# Patient Record
Sex: Female | Born: 1970 | Race: White | Hispanic: No | Marital: Married | Smoking: Former smoker
Health system: Southern US, Community
[De-identification: ages and names within clinical notes are randomized; demographics above are authoritative.]

## PROBLEM LIST (undated history)

## (undated) DIAGNOSIS — T4145XA Adverse effect of unspecified anesthetic, initial encounter: Secondary | ICD-10-CM

## (undated) DIAGNOSIS — N6009 Solitary cyst of unspecified breast: Secondary | ICD-10-CM

## (undated) DIAGNOSIS — I10 Essential (primary) hypertension: Secondary | ICD-10-CM

## (undated) DIAGNOSIS — T8859XA Other complications of anesthesia, initial encounter: Secondary | ICD-10-CM

## (undated) HISTORY — DX: Solitary cyst of unspecified breast: N60.09

## (undated) HISTORY — PX: BREAST CYST ASPIRATION: SHX578

---
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
## 1998-05-04 ENCOUNTER — Ambulatory Visit (HOSPITAL_BASED_OUTPATIENT_CLINIC_OR_DEPARTMENT_OTHER): Admission: RE | Admit: 1998-05-04 | Discharge: 1998-05-04 | Payer: Self-pay | Admitting: Orthopedic Surgery

## 1998-06-07 ENCOUNTER — Other Ambulatory Visit: Admission: RE | Admit: 1998-06-07 | Discharge: 1998-06-07 | Payer: Self-pay | Admitting: Obstetrics and Gynecology

## 1998-06-21 ENCOUNTER — Ambulatory Visit (HOSPITAL_COMMUNITY): Admission: RE | Admit: 1998-06-21 | Discharge: 1998-06-21 | Payer: Self-pay | Admitting: Obstetrics and Gynecology

## 1999-07-05 ENCOUNTER — Other Ambulatory Visit: Admission: RE | Admit: 1999-07-05 | Discharge: 1999-07-05 | Payer: Self-pay | Admitting: Obstetrics and Gynecology

## 2000-06-29 ENCOUNTER — Other Ambulatory Visit: Admission: RE | Admit: 2000-06-29 | Discharge: 2000-06-29 | Payer: Self-pay | Admitting: Obstetrics and Gynecology

## 2000-07-01 ENCOUNTER — Ambulatory Visit (HOSPITAL_COMMUNITY): Admission: RE | Admit: 2000-07-01 | Discharge: 2000-07-01 | Payer: Self-pay | Admitting: Obstetrics and Gynecology

## 2000-07-01 ENCOUNTER — Encounter: Payer: Self-pay | Admitting: Obstetrics and Gynecology

## 2001-02-15 ENCOUNTER — Inpatient Hospital Stay (HOSPITAL_COMMUNITY): Admission: AD | Admit: 2001-02-15 | Discharge: 2001-02-18 | Payer: Self-pay | Admitting: Obstetrics & Gynecology

## 2002-04-03 ENCOUNTER — Ambulatory Visit (HOSPITAL_COMMUNITY): Admission: AD | Admit: 2002-04-03 | Discharge: 2002-04-03 | Payer: Self-pay | Admitting: Obstetrics and Gynecology

## 2002-04-03 ENCOUNTER — Encounter: Payer: Self-pay | Admitting: Obstetrics and Gynecology

## 2002-04-19 ENCOUNTER — Other Ambulatory Visit: Admission: RE | Admit: 2002-04-19 | Discharge: 2002-04-19 | Payer: Self-pay | Admitting: Obstetrics and Gynecology

## 2002-11-11 ENCOUNTER — Inpatient Hospital Stay (HOSPITAL_COMMUNITY): Admission: AD | Admit: 2002-11-11 | Discharge: 2002-11-11 | Payer: Self-pay | Admitting: Obstetrics and Gynecology

## 2002-11-17 ENCOUNTER — Inpatient Hospital Stay (HOSPITAL_COMMUNITY): Admission: AD | Admit: 2002-11-17 | Discharge: 2002-11-17 | Payer: Self-pay | Admitting: Obstetrics & Gynecology

## 2003-09-01 ENCOUNTER — Other Ambulatory Visit: Admission: RE | Admit: 2003-09-01 | Discharge: 2003-09-01 | Payer: Self-pay | Admitting: Obstetrics and Gynecology

## 2003-10-11 ENCOUNTER — Ambulatory Visit (HOSPITAL_COMMUNITY): Admission: RE | Admit: 2003-10-11 | Discharge: 2003-10-11 | Payer: Self-pay

## 2004-09-24 ENCOUNTER — Other Ambulatory Visit: Admission: RE | Admit: 2004-09-24 | Discharge: 2004-09-24 | Payer: Self-pay | Admitting: Obstetrics and Gynecology

## 2006-12-24 ENCOUNTER — Encounter: Admission: RE | Admit: 2006-12-24 | Discharge: 2007-01-21 | Payer: Self-pay | Admitting: Nurse Practitioner

## 2011-08-28 ENCOUNTER — Ambulatory Visit: Payer: Self-pay

## 2011-09-26 ENCOUNTER — Ambulatory Visit: Payer: Self-pay

## 2013-11-24 ENCOUNTER — Ambulatory Visit: Payer: Self-pay | Admitting: Unknown Physician Specialty

## 2013-12-26 ENCOUNTER — Ambulatory Visit: Payer: Self-pay | Admitting: Obstetrics & Gynecology

## 2013-12-26 LAB — CBC
HCT: 37.4 % (ref 35.0–47.0)
HGB: 11.7 g/dL — ABNORMAL LOW (ref 12.0–16.0)
MCH: 29.1 pg (ref 26.0–34.0)
MCHC: 31.2 g/dL — ABNORMAL LOW (ref 32.0–36.0)
MCV: 93 fL (ref 80–100)
Platelet: 292 10*3/uL (ref 150–440)
RBC: 4 10*6/uL (ref 3.80–5.20)
RDW: 13.6 % (ref 11.5–14.5)
WBC: 11.5 10*3/uL — ABNORMAL HIGH (ref 3.6–11.0)

## 2013-12-30 ENCOUNTER — Ambulatory Visit: Payer: Self-pay | Admitting: Obstetrics & Gynecology

## 2013-12-30 HISTORY — PX: PARTIAL HYSTERECTOMY: SHX80

## 2013-12-31 LAB — HEMOGLOBIN: HGB: 11 g/dL — ABNORMAL LOW (ref 12.0–16.0)

## 2014-01-02 LAB — PATHOLOGY REPORT

## 2014-07-29 NOTE — Op Note (Signed)
PATIENT NAME:  Kelly, Lowery MR#:  [id number] DATE OF BIRTH:  06-08-1970  DATE OF PROCEDURE:  12/30/2013  PREOPERATIVE DIAGNOSIS: Menorrhagia.   POSTOPERATIVE DIAGNOSIS: Menorrhagia.  PROCEDURES: Laparoscopic supracervical hysterectomy, repair of cystotomy.   SURGEON: Glean Salen, M.D.   ASSISTANT: Malachy Mood, M.D.   ANESTHESIA: General.   ESTIMATED BLOOD LOSS: Minimal.   COMPLICATIONS: There was a dome of bladder cystotomy where bladder and cervix has significant adhesions. Ovaries were normal although they had 2 small 1 cm or less ovarian cysts with 1 on each ovary, which were aspirated.   DISPOSITION: To the recovery room in stable condition.   TECHNIQUE: The patient is prepped and draped in the usual sterile fashion after adequate anesthesia is obtained in the dorsal lithotomy position. Bladder has a Foley catheter inserted and a sponge stick is placed in the vagina for manipulation purposes.   Attention is then turned to the abdomen where a Veress needle is inserted through a 5 mm infraumbilical incision after Marcaine is used to anesthetize the skin. Veress needle placement is confirmed using the hanging drop technique and the abdomen is then insufflated with CO2 gas. The patient is placed in Trendelenburg position and the above-mentioned findings are visualized. In the right lower quadrant, an 11 mm trocar is then inserted under direct visualization with the laparoscope with no injuries or bleeding noted and this is placed lateral to the inferior epigastric blood vessels. A 5 mm incision and trocar is placed in the left lower quadrant.   The uterus is grasped with a grasper and the round ligaments are carefully coagulated and cut using 5 mm Harmonic scalpel. There are no visible fallopian tubes on either side consistent with the patient's history of prior salpingectomy for ectopic pregnancy, at least on 1 side, but no tubes are visualized on either side. The dissection is  carried down through the utero-ovarian blood vessels and their ligaments with preservation of the ovaries and their blood supply. The ovarian cysts on each ovary are very small and they are aspirated by touching them with bipolar cautery device, and no bleeding is noted from the site where they rupture clear fluid.   Dissection with the 5 mm Harmonic scalpel is carried down through the broad ligament anterior and posterior leaves to the level of the uterine arteries. The uterine arteries are carefully coagulated using bipolar cautery device. The bladder is attempted to be dissected free of the lower uterine segment and the cervix, although significant adhesions are noted from the patient's history of prior surgery, such as cesarean section. During the course of the dissection, a cystotomy is encountered at the dome of the bladder. Visualization with the laparoscope into the bladder reveals no other injuries and no involvement where the ureters come into the bladder.   Once the uterine arteries are carefully skeletonized, cauterized and enough of the cervix is freed from the dissection, the uterus is amputated. Approximately 1 cm of cervix is left in place. The endocervical canal is cauterized with Bovie electrocautery and hemostasis is assured in this vicinity. Irrigation with saline is performed with aspiration of all fluid.   The cystotomy is repaired with interrupted Polysorb sutures using the Endo Stitch device. An imbrication layer is also placed to oversew healthy tissue over the cystotomy incision repair. The bladder is then instilled with saline fluid with methylene blue and there is no leakage of fluid through the repair after 120 mL is injected into the bladder. The Foley catheter is then reattached.  The morcellator device is placed through the right lower quadrant incision and the uterus is then removed by morcellation process without difficulty or complication. The pelvic cavity is re-irrigated  as well as examined and excellent hemostasis is noted. Interceed is placed over the cervical stump to minimize adhesion formation in this vicinity. The patient is leveled. A fascial closure device is placed through the right lower quadrant incision with closure of this incision using a 0 Vicryl suture at the fascial site. The remaining trocars are removed with removing of all gas as possible done as well. In the right lower quadrant incision, a 3-0 Vicryl suture is used to close the subcutaneous layer and then the skin at all 3 sites is closed with Dermabond. Foley catheter is left in place as the patient goes to the recovery room and the sponge stick is removed. The patient tolerates the procedure well. All sponge, instrument, and needle counts are correct. ____________________________ R. Barnett Applebaum, MD rph:sb D: 12/30/2013 12:31:46 ET T: 12/30/2013 13:17:04 ET JOB#: [id number]  cc: Glean Salen, MD, <Dictator> Gae Dry MD ELECTRONICALLY SIGNED 12/30/2013 15:08

## 2016-01-07 ENCOUNTER — Telehealth: Payer: Self-pay | Admitting: Internal Medicine

## 2016-01-07 NOTE — Telephone Encounter (Signed)
I do not mind seeing her as a pt.  Do we have a spot to do a work in for this and then can set up for full establish care, etc?  Thanks.

## 2016-01-07 NOTE — Telephone Encounter (Signed)
Pt mother in law Lucas,Faye wants to know if Dr. Nicki Reaper will take her on as a new patient. Pt has a lump on her left breast that needs to be looked at. Please Advise. Thank you!  Call mother in law [phone]

## 2016-01-07 NOTE — Telephone Encounter (Signed)
Please advise 

## 2016-01-09 ENCOUNTER — Encounter: Payer: Self-pay | Admitting: Internal Medicine

## 2016-01-09 ENCOUNTER — Encounter (INDEPENDENT_AMBULATORY_CARE_PROVIDER_SITE_OTHER): Payer: Self-pay

## 2016-01-09 ENCOUNTER — Ambulatory Visit (INDEPENDENT_AMBULATORY_CARE_PROVIDER_SITE_OTHER): Payer: PRIVATE HEALTH INSURANCE | Admitting: Internal Medicine

## 2016-01-09 VITALS — BP 124/82 | HR 81 | Temp 98.3°F | Ht 66.5 in | Wt 146.2 lb

## 2016-01-09 DIAGNOSIS — N63 Unspecified lump in unspecified breast: Secondary | ICD-10-CM

## 2016-01-09 DIAGNOSIS — Z803 Family history of malignant neoplasm of breast: Secondary | ICD-10-CM

## 2016-01-09 DIAGNOSIS — N6002 Solitary cyst of left breast: Secondary | ICD-10-CM | POA: Diagnosis not present

## 2016-01-09 DIAGNOSIS — Z0000 Encounter for general adult medical examination without abnormal findings: Secondary | ICD-10-CM

## 2016-01-09 NOTE — Progress Notes (Signed)
Pre visit review using our clinic review tool, if applicable. No additional management support is needed unless otherwise documented below in the visit note. 

## 2016-01-09 NOTE — Progress Notes (Signed)
Patient ID: Kelly Lowery, female   DOB: [birth year], 45 y.o.   MRN: [id number]   Subjective:    Patient ID: Kelly Lowery, female    DOB: 1970/05/20, 45 y.o.   MRN: [id number]  HPI  Patient here as work in for left breast nodule and tenderness.  She is a new pt to me.  Has an establish care appt with me in the near future.  Called in with concerns of breast tenderness, so worked in for evaluation.  She reports that two years ago she had a breast cyst.  Checked with mammogram.  Felt no further intervention warranted.  States that she noticed some tenderness about 1 1/2 months ago.  Noticed tenderness in her left axilla and left breast.  Some nipple itching, but no discharge.  Persistent, so she decided to come in for evaluation.  Mother has a history of ovarian cancer.  Maternal grandmother - breast cancer.     History reviewed. No pertinent past medical history. Past Surgical History:  Procedure Laterality Date  . CESAREAN SECTION     two  . PARTIAL HYSTERECTOMY  12/30/2013  . TONSILLECTOMY  1974   Family History  Problem Relation Age of Onset  . Breast cancer Maternal Grandmother 47    47 and 60  . Lung cancer Paternal Aunt    Social History   Social History  . Marital status: Married    Spouse name: N/A  . Number of children: N/A  . Years of education: N/A   Social History Main Topics  . Smoking status: Heavy Tobacco Smoker    Packs/day: 0.50    Years: 20.00    Types: Cigarettes  . Smokeless tobacco: Never Used  . Alcohol use Yes     Comment: occasionally  . Drug use: No  . Sexual activity: Not Asked   Other Topics Concern  . None   Social History Narrative  . None    Outpatient Encounter Prescriptions as of 01/09/2016  Medication Sig  . aspirin EC 81 MG tablet Take 81 mg by mouth daily.  . Calcium Carbonate-Vit D-Min (CALCIUM 1200 PO) Take by mouth.  . cyanocobalamin 1000 MCG tablet Take 1,000 mcg by mouth daily.  . Multiple Vitamin (MULTIVITAMIN) capsule Take 1  capsule by mouth daily.   No facility-administered encounter medications on file as of 01/09/2016.     Review of Systems  Constitutional: Negative for appetite change and unexpected weight change.  Respiratory: Negative for cough, chest tightness and shortness of breath.   Cardiovascular: Negative for chest pain and leg swelling.  Gastrointestinal: Negative for abdominal pain, nausea and vomiting.  Musculoskeletal: Negative for back pain and myalgias.  Skin: Negative for color change and rash.  Neurological: Negative for dizziness and headaches.  Psychiatric/Behavioral: Negative for agitation and dysphoric mood.       Objective:    Physical Exam  Constitutional: She appears well-developed and well-nourished. No distress.  Neck: Neck supple.  Cardiovascular: Normal rate and regular rhythm.   Pulmonary/Chest: Effort normal and breath sounds normal. No respiratory distress.  Breast exam - no nipple discharge or nipple retraction present.  Left breast tenderness and fullness.  Right breast - non tender.  No palpable nodule right breast.  No axillary adenopathy.    Abdominal: Soft. Bowel sounds are normal. There is no tenderness.  Musculoskeletal: She exhibits no edema or tenderness.  Lymphadenopathy:    She has no cervical adenopathy.  Skin: No rash noted. No erythema.  Psychiatric: She has a  normal mood and affect. Her behavior is normal.    BP 124/82   Pulse 81   Temp 98.3 F (36.8 C) (Oral)   Ht 5' 6.5" (1.689 m)   Wt 146 lb 3.2 oz (66.3 kg)   SpO2 98%   BMI 23.24 kg/m  Wt Readings from Last 3 Encounters:  01/17/16 143 lb (64.9 kg)  01/09/16 146 lb 3.2 oz (66.3 kg)     Lab Results  Component Value Date   WBC 11.5 (H) 12/26/2013   HGB 11.0 (L) 12/31/2013   HCT 37.4 12/26/2013   PLT 292 12/26/2013       Assessment & Plan:   Problem List Items Addressed This Visit    Breast cyst, left    Left breast fullness and tenderness.  Has a history of known breast cyst.   Schedule diagnostic mammogram with ultrasound.  Further w/up pending.        Family history of breast cancer    Maternal GM with breast cancer.  Mother with history of ovarian.  Discussed genetic testing.        Health care maintenance    New pt to me.  Was worked in secondary to her concerns with breast tenderness.  Get her back in for physical.         Other Visit Diagnoses    Breast nodule    -  Primary   Relevant Orders   US BREAST LTD UNI LEFT INC AXILLA (Completed)       Einar Pheasant, MD

## 2016-01-10 ENCOUNTER — Ambulatory Visit
Admission: RE | Admit: 2016-01-10 | Discharge: 2016-01-10 | Disposition: A | Discharge status: Home / Self Care | Payer: 59 | Source: Ambulatory Visit | Attending: Internal Medicine | Admitting: Internal Medicine

## 2016-01-10 ENCOUNTER — Other Ambulatory Visit: Payer: Self-pay | Admitting: Internal Medicine

## 2016-01-10 DIAGNOSIS — N63 Unspecified lump in unspecified breast: Secondary | ICD-10-CM

## 2016-01-10 DIAGNOSIS — N6002 Solitary cyst of left breast: Secondary | ICD-10-CM | POA: Diagnosis not present

## 2016-01-11 ENCOUNTER — Other Ambulatory Visit: Payer: Self-pay | Admitting: Internal Medicine

## 2016-01-11 DIAGNOSIS — N6002 Solitary cyst of left breast: Secondary | ICD-10-CM

## 2016-01-11 NOTE — Progress Notes (Signed)
Opened in error

## 2016-01-15 ENCOUNTER — Other Ambulatory Visit: Payer: Self-pay | Admitting: Internal Medicine

## 2016-01-15 DIAGNOSIS — N6002 Solitary cyst of left breast: Secondary | ICD-10-CM

## 2016-01-15 NOTE — Progress Notes (Signed)
Order placed for referral.  

## 2016-01-16 ENCOUNTER — Encounter: Payer: Self-pay | Admitting: *Deleted

## 2016-01-17 ENCOUNTER — Ambulatory Visit (INDEPENDENT_AMBULATORY_CARE_PROVIDER_SITE_OTHER): Payer: PRIVATE HEALTH INSURANCE | Admitting: General Surgery

## 2016-01-17 ENCOUNTER — Encounter: Payer: Self-pay | Admitting: General Surgery

## 2016-01-17 ENCOUNTER — Inpatient Hospital Stay: Payer: Self-pay

## 2016-01-17 VITALS — BP 118/80 | HR 78 | Resp 14 | Ht 66.5 in | Wt 143.0 lb

## 2016-01-17 DIAGNOSIS — N6320 Unspecified lump in the left breast, unspecified quadrant: Secondary | ICD-10-CM

## 2016-01-17 DIAGNOSIS — N6002 Solitary cyst of left breast: Secondary | ICD-10-CM | POA: Diagnosis not present

## 2016-01-17 NOTE — Patient Instructions (Addendum)
Follow up as needed. The patient is aware to call back for any questions or concerns. Continue self breast exams. Call office for any new breast issues or concerns.

## 2016-01-17 NOTE — Progress Notes (Signed)
Patient ID: Kelly Lowery, female   DOB: 1970/08/29, 45 y.o.   MRN: [id number]  Chief Complaint  Patient presents with  . Other    mammogram    HPI Kelly Lowery is a 45 y.o. female.  who presents for a breast evaluation. The most recent mammogram and left ultrasound was done on 01-10-16. She states she has a history of mild nipple drainage until she had the hysterectomy and she had not seen any until the the mammogram. She has a known history of left breast cyst 2 years ago. She states a couple months ago her left breast became tender and both itching nipples. She does admit to bilateral breast tenderness and left axillary tenderness. Patient does perform regular self breast checks and gets regular mammograms done every 2 years.    She states she felt the lump in left breast about 2 months ago. She states it is tender to touch and maybe a little larger than before.  HPI  No past medical history on file.  Past Surgical History:  Procedure Laterality Date  . CESAREAN SECTION     two  . PARTIAL HYSTERECTOMY  12/30/2013  . TONSILLECTOMY  1974    Family History  Problem Relation Age of Onset  . Breast cancer Maternal Grandmother 47    47 and 60  . Lung cancer Paternal Aunt     Social History Social History  Substance Use Topics  . Smoking status: Heavy Tobacco Smoker    Packs/day: 0.50    Years: 20.00    Types: Cigarettes  . Smokeless tobacco: Never Used  . Alcohol use Yes     Comment: occasionally    Allergies  Allergen Reactions  . Keflex [Cephalexin] Rash    Current Outpatient Prescriptions  Medication Sig Dispense Refill  . aspirin EC 81 MG tablet Take 81 mg by mouth daily.    . Calcium Carbonate-Vit D-Min (CALCIUM 1200 PO) Take by mouth.    . cyanocobalamin 1000 MCG tablet Take 1,000 mcg by mouth daily.    . Multiple Vitamin (MULTIVITAMIN) capsule Take 1 capsule by mouth daily.     No current facility-administered medications for this visit.     Review of  Systems Review of Systems  Blood pressure 118/80, pulse 78, resp. rate 14, height 5' 6.5" (1.689 m), weight 143 lb (64.9 kg).  Physical Exam Physical Exam  Constitutional: She is oriented to person, place, and time. She appears well-developed and well-nourished.  HENT:  Mouth/Throat: Oropharynx is clear and moist.  Eyes: Conjunctivae are normal. No scleral icterus.  Neck: Neck supple.  Cardiovascular: Normal rate, regular rhythm and normal heart sounds.   Pulmonary/Chest: Effort normal and breath sounds normal. Right breast exhibits tenderness. Right breast exhibits no inverted nipple, no mass, no nipple discharge and no skin change. Left breast exhibits no inverted nipple, no mass, no nipple discharge, no skin change and no tenderness.    Left breast fuller than right breast. Tenderness right axillary tail. 3.5 cm area of fullness left breast at 1 o'clock.  Lymphadenopathy:    She has no cervical adenopathy.    She has no axillary adenopathy.  Neurological: She is alert and oriented to person, place, and time.  Skin: Skin is warm and dry.  Psychiatric: Her behavior is normal.    Data Reviewed Mammograms dated 01/10/2016 and left breast ultrasound the same day reviewed. BI-RADS-2.  Ultrasound examination of the left breast showed multiple cysts in the upper-outer quadrant, the palpable mass corresponding to  the largest lesion. This was a thick-walled (0.18 cm) irregular mass measuring up to 2.83 cm in diameter. Multiple adjacent smaller cyst measuring up to 0.6 cm were noted. Moderate retroareolar ductal dilatation noted.  The patient was amenable to aspiration for symptom relief. This was completed using 1 mL of 1% plain Xylocaine. Approximately 35 mL of clear fluid was obtained. Complete resolution of the lesion was noted. The cavity was injected with 10 mL of air without difficulty.  Examination of the right retroareolar area showed multiple dilated ducts without any intraductal  lesions. BI-RADS-2.  Assessment    Symptomatic left breast cyst, resolved on aspiration.    Plan    The patient will call if she develops recurrent discomfort, otherwise she may resume annual screening mammograms next fall with her primary care provider.    Follow up as needed. The patient is aware to call back for any questions or concerns. Continue self breast exams. Call office for any new breast issues or concerns.   This information has been scribed by Karie Fetch RN, BSN,BC.   Robert Bellow 01/17/2016, 9:15 PM

## 2016-01-21 ENCOUNTER — Encounter: Payer: Self-pay | Admitting: Internal Medicine

## 2016-01-21 DIAGNOSIS — Z803 Family history of malignant neoplasm of breast: Secondary | ICD-10-CM | POA: Insufficient documentation

## 2016-01-21 DIAGNOSIS — Z0000 Encounter for general adult medical examination without abnormal findings: Secondary | ICD-10-CM | POA: Insufficient documentation

## 2016-01-21 NOTE — Assessment & Plan Note (Signed)
Left breast fullness and tenderness.  Has a history of known breast cyst.  Schedule diagnostic mammogram with ultrasound.  Further w/up pending.

## 2016-01-21 NOTE — Assessment & Plan Note (Signed)
Maternal GM with breast cancer.  Mother with history of ovarian.  Discussed genetic testing.

## 2016-01-21 NOTE — Assessment & Plan Note (Signed)
New pt to me.  Was worked in secondary to her concerns with breast tenderness.  Get her back in for physical.

## 2016-02-13 ENCOUNTER — Ambulatory Visit (INDEPENDENT_AMBULATORY_CARE_PROVIDER_SITE_OTHER): Payer: PRIVATE HEALTH INSURANCE | Admitting: Internal Medicine

## 2016-02-13 ENCOUNTER — Encounter: Payer: Self-pay | Admitting: Internal Medicine

## 2016-02-13 VITALS — BP 120/82 | HR 76 | Temp 98.2°F | Ht 67.0 in | Wt 151.4 lb

## 2016-02-13 DIAGNOSIS — G479 Sleep disorder, unspecified: Secondary | ICD-10-CM

## 2016-02-13 DIAGNOSIS — R5383 Other fatigue: Secondary | ICD-10-CM | POA: Diagnosis not present

## 2016-02-13 DIAGNOSIS — Z13220 Encounter for screening for lipoid disorders: Secondary | ICD-10-CM

## 2016-02-13 DIAGNOSIS — Z0000 Encounter for general adult medical examination without abnormal findings: Secondary | ICD-10-CM | POA: Diagnosis not present

## 2016-02-13 DIAGNOSIS — Z803 Family history of malignant neoplasm of breast: Secondary | ICD-10-CM | POA: Diagnosis not present

## 2016-02-13 DIAGNOSIS — Z23 Encounter for immunization: Secondary | ICD-10-CM | POA: Diagnosis not present

## 2016-02-13 DIAGNOSIS — N6002 Solitary cyst of left breast: Secondary | ICD-10-CM

## 2016-02-13 NOTE — Progress Notes (Signed)
Pre visit review using our clinic review tool, if applicable. No additional management support is needed unless otherwise documented below in the visit note. 

## 2016-02-13 NOTE — Progress Notes (Signed)
Patient ID: Kelly Lowery, female   DOB: 04-Dec-1970, 45 y.o.   MRN: [id number]   Subjective:    Patient ID: Kelly Lowery, female    DOB: [birth year], 45 y.o.   MRN: [id number]  HPI  Patient here to establish care.  I saw her as a work in recently for breast mass.  Had mammogram 01/10/16 - [facility].  Referred to Dr Bary Castilla.  S/p aspiration of breast cyst.  Pain has resolved.  She reports noticing some increased fatigue.  Has some problems falling asleep.  States her mind just will not shut down at times.  Occasionally will take advil pm.  Handling stress well.  Stays active.  No chest pain.  No sob.  No acid reflux.  No abdominal pain or cramping.  Bowels stable.  Is s/p hysterectomy secondary to bleeding.    Past Medical History:  Diagnosis Date  . Breast cyst    s/p aspiration   Past Surgical History:  Procedure Laterality Date  . CESAREAN SECTION     two  . PARTIAL HYSTERECTOMY  12/30/2013  . TONSILLECTOMY  1974   Family History  Problem Relation Age of Onset  . Breast cancer Maternal Grandmother 47    47 and 60  . Lung cancer Paternal Aunt    Social History   Social History  . Marital status: Married    Spouse name: N/A  . Number of children: N/A  . Years of education: N/A   Social History Main Topics  . Smoking status: Light Tobacco Smoker    Packs/day: 0.50    Years: 20.00    Types: Cigarettes  . Smokeless tobacco: Never Used  . Alcohol use Yes     Comment: occasionally  . Drug use: No  . Sexual activity: Not Asked   Other Topics Concern  . None   Social History Narrative  . None    Outpatient Encounter Prescriptions as of 02/13/2016  Medication Sig  . aspirin EC 81 MG tablet Take 81 mg by mouth daily.  . Calcium Carbonate-Vit D-Min (CALCIUM 1200 PO) Take by mouth.  . cyanocobalamin 1000 MCG tablet Take 1,000 mcg by mouth daily.  . Multiple Vitamin (MULTIVITAMIN) capsule Take 1 capsule by mouth daily.   No facility-administered encounter medications on  file as of 02/13/2016.     Review of Systems  Constitutional: Positive for fatigue. Negative for appetite change and unexpected weight change.  HENT: Negative for congestion and sinus pressure.   Respiratory: Negative for cough, chest tightness and shortness of breath.   Cardiovascular: Negative for chest pain, palpitations and leg swelling.  Gastrointestinal: Negative for abdominal pain, diarrhea, nausea and vomiting.  Genitourinary: Negative for difficulty urinating and dysuria.  Musculoskeletal: Negative for back pain and joint swelling.  Skin: Negative for color change and rash.  Neurological: Negative for dizziness, light-headedness and headaches.  Psychiatric/Behavioral: Negative for agitation and dysphoric mood.       Objective:    Physical Exam  Constitutional: She appears well-developed and well-nourished. No distress.  HENT:  Nose: Nose normal.  Mouth/Throat: Oropharynx is clear and moist.  Neck: Neck supple. No thyromegaly present.  Cardiovascular: Normal rate and regular rhythm.   Pulmonary/Chest: Breath sounds normal. No respiratory distress. She has no wheezes.  Abdominal: Soft. Bowel sounds are normal. There is no tenderness.  Musculoskeletal: She exhibits no edema or tenderness.  Lymphadenopathy:    She has no cervical adenopathy.  Skin: No rash noted. No erythema.  Psychiatric: She has a normal  mood and affect. Her behavior is normal.    BP 120/82   Pulse 76   Temp 98.2 F (36.8 C) (Oral)   Ht 5\' 7"  (1.702 m)   Wt 151 lb 6.4 oz (68.7 kg)   SpO2 98%   BMI 23.71 kg/m  Wt Readings from Last 3 Encounters:  02/13/16 151 lb 6.4 oz (68.7 kg)  01/17/16 143 lb (64.9 kg)  01/09/16 146 lb 3.2 oz (66.3 kg)     Lab Results  Component Value Date   WBC 11.5 (H) 12/26/2013   HGB 11.0 (L) 12/31/2013   HCT 37.4 12/26/2013   PLT 292 12/26/2013    US Breast Ltd Uni Left Inc Axilla  Result Date: 01/10/2016 CLINICAL DATA:  The patient complains of a lump in the  upper, outer left breast for 1 and half months. In addition, the patient has been having intermittent pain in her left axilla which extends across her superior left breast. She also feels that the superior left breast has been swelling. During the course of her mammogram, a small amount of yellowish discharge was noted from the left nipple. The patient reports that she had yellowish clear nipple discharge up until about 5 years after having her last child 14 years ago. She has not had any spontaneous discharge since that time. EXAM: 2D DIGITAL DIAGNOSTIC BILATERAL MAMMOGRAM WITH CAD AND ADJUNCT TOMO ULTRASOUND LEFT BREAST COMPARISON:  Previous exam(s). ACR Breast Density Category d: The breast tissue is extremely dense, which lowers the sensitivity of mammography. FINDINGS: There are multiple bilateral oval, circumscribed masses, the largest underlying the palpable marker in the upper, outer left breast measuring 3.3 cm. No suspicious mass, calcifications, or other abnormality is identified within either breast. Mammographic images were processed with CAD. On physical exam, there is an approximately 3.5 cm firm, mobile mass within the left breast at 1 o'clock, 2 cm from the nipple, corresponding to the palpable abnormality. No additional mass is felt in the areas of concern within the superior left breast and axilla. Targeted ultrasound is performed, showing a 3.4 x 1.9 x 2.6 cm simple cyst at 1 o'clock, 2 cm from the nipple, corresponding to the palpable abnormality. No suspicious cystic or solid sonographic findings were identified in the areas of concern within the superior left breast and axilla. Normal appearing axillary lymph nodes were incidentally noted. Targeted ultrasound of the left retroareolar region demonstrated no intraductal masses or other suspicious findings. IMPRESSION: Left breast cyst. No mammographic or sonographic evidence of malignancy. RECOMMENDATION: 1. Ultrasound-guided aspiration of a  left breast cyst at 1 o'clock, 2 cm from the nipple. 2.  Screening mammogram in one year.(Code:SM-B-01Y) I have discussed the findings and recommendations with the patient. Results were also provided in writing at the conclusion of the visit. If applicable, a reminder letter will be sent to the patient regarding the next appointment. BI-RADS CATEGORY  2: Benign. Electronically Signed   By: Pamelia Hoit M.D.   On: 01/10/2016 16:28   Mm Diag Breast Tomo Bilateral  Result Date: 01/10/2016 CLINICAL DATA:  The patient complains of a lump in the upper, outer left breast for 1 and half months. In addition, the patient has been having intermittent pain in her left axilla which extends across her superior left breast. She also feels that the superior left breast has been swelling. During the course of her mammogram, a small amount of yellowish discharge was noted from the left nipple. The patient reports that she had yellowish clear  nipple discharge up until about 5 years after having her last child 14 years ago. She has not had any spontaneous discharge since that time. EXAM: 2D DIGITAL DIAGNOSTIC BILATERAL MAMMOGRAM WITH CAD AND ADJUNCT TOMO ULTRASOUND LEFT BREAST COMPARISON:  Previous exam(s). ACR Breast Density Category d: The breast tissue is extremely dense, which lowers the sensitivity of mammography. FINDINGS: There are multiple bilateral oval, circumscribed masses, the largest underlying the palpable marker in the upper, outer left breast measuring 3.3 cm. No suspicious mass, calcifications, or other abnormality is identified within either breast. Mammographic images were processed with CAD. On physical exam, there is an approximately 3.5 cm firm, mobile mass within the left breast at 1 o'clock, 2 cm from the nipple, corresponding to the palpable abnormality. No additional mass is felt in the areas of concern within the superior left breast and axilla. Targeted ultrasound is performed, showing a 3.4 x 1.9 x 2.6 cm  simple cyst at 1 o'clock, 2 cm from the nipple, corresponding to the palpable abnormality. No suspicious cystic or solid sonographic findings were identified in the areas of concern within the superior left breast and axilla. Normal appearing axillary lymph nodes were incidentally noted. Targeted ultrasound of the left retroareolar region demonstrated no intraductal masses or other suspicious findings. IMPRESSION: Left breast cyst. No mammographic or sonographic evidence of malignancy. RECOMMENDATION: 1. Ultrasound-guided aspiration of a left breast cyst at 1 o'clock, 2 cm from the nipple. 2.  Screening mammogram in one year.(Code:SM-B-01Y) I have discussed the findings and recommendations with the patient. Results were also provided in writing at the conclusion of the visit. If applicable, a reminder letter will be sent to the patient regarding the next appointment. BI-RADS CATEGORY  2: Benign. Electronically Signed   By: Pamelia Hoit M.D.   On: 01/10/2016 16:28       Assessment & Plan:   Problem List Items Addressed This Visit    Breast cyst, left    S/p breast aspiration.  Pain resolved.  Doing well.  Mammogram 01/10/16 - [facility].        Family history of breast cancer    Mammogram 01/10/16 - [facility].        Fatigue    She relates to not being able to fall asleep.  Discussed some behavior modification.  Can try melatonin.  Follow.        Relevant Orders   CBC with Differential/Platelet   Comprehensive metabolic panel   TSH   VITAMIN D 25 Hydroxy (Vit-D Deficiency, Fractures)   Health care maintenance    Get her back in for physical.  Mammogram 01/10/16 -[facility].        Sleeping difficulties    Discussed behavior modification.  Discussed melatonin.  Follow.         Other Visit Diagnoses    Screening cholesterol level    -  Primary   Relevant Orders   Lipid panel   Encounter for immunization       Relevant Orders   Flu Vaccine QUAD 36+ mos IM (Completed)       Einar Pheasant, MD

## 2016-02-14 ENCOUNTER — Encounter: Payer: Self-pay | Admitting: Internal Medicine

## 2016-02-14 DIAGNOSIS — G479 Sleep disorder, unspecified: Secondary | ICD-10-CM | POA: Insufficient documentation

## 2016-02-14 DIAGNOSIS — R5383 Other fatigue: Secondary | ICD-10-CM | POA: Insufficient documentation

## 2016-02-14 NOTE — Assessment & Plan Note (Addendum)
Get her back in for physical.  Mammogram 01/10/16 -[facility].

## 2016-02-14 NOTE — Assessment & Plan Note (Signed)
Mammogram 01/10/16 - [facility].

## 2016-02-14 NOTE — Assessment & Plan Note (Signed)
S/p breast aspiration.  Pain resolved.  Doing well.  Mammogram 01/10/16 - [facility].

## 2016-02-14 NOTE — Assessment & Plan Note (Signed)
She relates to not being able to fall asleep.  Discussed some behavior modification.  Can try melatonin.  Follow.

## 2016-02-14 NOTE — Assessment & Plan Note (Signed)
Discussed behavior modification.  Discussed melatonin.  Follow.

## 2016-02-21 ENCOUNTER — Other Ambulatory Visit (INDEPENDENT_AMBULATORY_CARE_PROVIDER_SITE_OTHER): Payer: PRIVATE HEALTH INSURANCE

## 2016-02-21 DIAGNOSIS — Z13220 Encounter for screening for lipoid disorders: Secondary | ICD-10-CM

## 2016-02-21 DIAGNOSIS — R5383 Other fatigue: Secondary | ICD-10-CM | POA: Diagnosis not present

## 2016-02-21 LAB — CBC WITH DIFFERENTIAL/PLATELET
Basophils Absolute: 0.1 10*3/uL (ref 0.0–0.1)
Basophils Relative: 0.7 % (ref 0.0–3.0)
Eosinophils Absolute: 0.1 10*3/uL (ref 0.0–0.7)
Eosinophils Relative: 1.1 % (ref 0.0–5.0)
HCT: 38.8 % (ref 36.0–46.0)
Hemoglobin: 13 g/dL (ref 12.0–15.0)
Lymphocytes Relative: 39.1 % (ref 12.0–46.0)
Lymphs Abs: 3.4 10*3/uL (ref 0.7–4.0)
MCHC: 33.6 g/dL (ref 30.0–36.0)
MCV: 91 fl (ref 78.0–100.0)
Monocytes Absolute: 0.6 10*3/uL (ref 0.1–1.0)
Monocytes Relative: 7.4 % (ref 3.0–12.0)
Neutro Abs: 4.5 10*3/uL (ref 1.4–7.7)
Neutrophils Relative %: 51.7 % (ref 43.0–77.0)
Platelets: 330 10*3/uL (ref 150.0–400.0)
RBC: 4.26 Mil/uL (ref 3.87–5.11)
RDW: 13.1 % (ref 11.5–15.5)
WBC: 8.7 10*3/uL (ref 4.0–10.5)

## 2016-02-21 LAB — COMPREHENSIVE METABOLIC PANEL
ALT: 22 U/L (ref 0–35)
AST: 22 U/L (ref 0–37)
Albumin: 4.3 g/dL (ref 3.5–5.2)
Alkaline Phosphatase: 71 U/L (ref 39–117)
BUN: 12 mg/dL (ref 6–23)
CO2: 27 mEq/L (ref 19–32)
Calcium: 9.4 mg/dL (ref 8.4–10.5)
Chloride: 106 mEq/L (ref 96–112)
Creatinine, Ser: 0.89 mg/dL (ref 0.40–1.20)
GFR: 72.1 mL/min (ref >60.00)
Glucose, Bld: 99 mg/dL (ref 70–99)
Potassium: 4.6 mEq/L (ref 3.5–5.1)
Sodium: 139 mEq/L (ref 135–145)
Total Bilirubin: 0.4 mg/dL (ref 0.2–1.2)
Total Protein: 7 g/dL (ref 6.0–8.3)

## 2016-02-21 LAB — TSH: TSH: 1.37 u[IU]/mL (ref 0.35–4.50)

## 2016-02-21 LAB — LIPID PANEL
Cholesterol: 185 mg/dL (ref 0–200)
HDL: 58.2 mg/dL (ref >39.00)
LDL Cholesterol: 113 mg/dL — ABNORMAL HIGH (ref 0–99)
NonHDL: 127.06
Total CHOL/HDL Ratio: 3
Triglycerides: 69 mg/dL (ref 0.0–149.0)
VLDL: 13.8 mg/dL (ref 0.0–40.0)

## 2016-02-21 LAB — VITAMIN D 25 HYDROXY (VIT D DEFICIENCY, FRACTURES): VITD: 26.25 ng/mL — ABNORMAL LOW (ref 30.00–100.00)

## 2016-05-16 ENCOUNTER — Other Ambulatory Visit (HOSPITAL_COMMUNITY)
Admission: RE | Admit: 2016-05-16 | Discharge: 2016-05-16 | Disposition: A | Discharge status: Home / Self Care | Payer: 59 | Source: Ambulatory Visit | Attending: Internal Medicine | Admitting: Internal Medicine

## 2016-05-16 ENCOUNTER — Ambulatory Visit (INDEPENDENT_AMBULATORY_CARE_PROVIDER_SITE_OTHER): Payer: PRIVATE HEALTH INSURANCE | Admitting: Internal Medicine

## 2016-05-16 ENCOUNTER — Encounter: Payer: Self-pay | Admitting: Internal Medicine

## 2016-05-16 VITALS — BP 110/62 | HR 79 | Temp 97.8°F | Resp 16 | Ht 67.13 in | Wt 153.6 lb

## 2016-05-16 DIAGNOSIS — Z124 Encounter for screening for malignant neoplasm of cervix: Secondary | ICD-10-CM | POA: Insufficient documentation

## 2016-05-16 DIAGNOSIS — N6002 Solitary cyst of left breast: Secondary | ICD-10-CM

## 2016-05-16 DIAGNOSIS — Z1151 Encounter for screening for human papillomavirus (HPV): Secondary | ICD-10-CM | POA: Diagnosis present

## 2016-05-16 DIAGNOSIS — Z0000 Encounter for general adult medical examination without abnormal findings: Secondary | ICD-10-CM | POA: Diagnosis not present

## 2016-05-16 DIAGNOSIS — Z803 Family history of malignant neoplasm of breast: Secondary | ICD-10-CM | POA: Diagnosis not present

## 2016-05-16 MED ORDER — VARENICLINE TARTRATE 1 MG PO TABS: 1.0000 mg | ORAL_TABLET | Freq: Two times a day (BID) | ORAL | 1 refills | Status: DC

## 2016-05-16 MED ORDER — VARENICLINE TARTRATE 0.5 MG X 11 & 1 MG X 42 PO MISC: ORAL | 0 refills | Status: DC

## 2016-05-16 NOTE — Progress Notes (Signed)
Pre-visit discussion using our clinic review tool. No additional management support is needed unless otherwise documented below in the visit note.  

## 2016-05-16 NOTE — Progress Notes (Signed)
Patient ID: Kelly Lowery, female   DOB: 1970-09-14, 46 y.o.   MRN: [id number]   Subjective:    Patient ID: Kelly Lowery, female    DOB: 1970-04-17, 46 y.o.   MRN: [id number]  HPI  Patient here for her physical exam.  She reports she is doing well.  Tries to stay active.  No chest pain.  No sob.  No acid reflux.  No abdominal pain or cramping.  Bowels stable.  Handling stress.  Overall feels good.     Past Medical History:  Diagnosis Date  . Breast cyst    s/p aspiration   Past Surgical History:  Procedure Laterality Date  . CESAREAN SECTION     two  . PARTIAL HYSTERECTOMY  12/30/2013  . TONSILLECTOMY  1974   Family History  Problem Relation Age of Onset  . Breast cancer Maternal Grandmother 47    47 and 60  . Lung cancer Paternal Aunt    Social History   Social History  . Marital status: Married    Spouse name: N/A  . Number of children: N/A  . Years of education: N/A   Social History Main Topics  . Smoking status: Light Tobacco Smoker    Packs/day: 0.50    Years: 20.00    Types: Cigarettes  . Smokeless tobacco: Never Used  . Alcohol use Yes     Comment: occasionally  . Drug use: No  . Sexual activity: Not Asked   Other Topics Concern  . None   Social History Narrative  . None    Outpatient Encounter Prescriptions as of 05/16/2016  Medication Sig  . aspirin EC 81 MG tablet Take 81 mg by mouth daily.  . Calcium Carbonate-Vit D-Min (CALCIUM 1200 PO) Take by mouth.  . cyanocobalamin 1000 MCG tablet Take 1,000 mcg by mouth daily.  . Multiple Vitamin (MULTIVITAMIN) capsule Take 1 capsule by mouth daily.  . varenicline (CHANTIX CONTINUING MONTH PAK) 1 MG tablet Take 1 tablet (1 mg total) by mouth 2 (two) times daily.  . varenicline (CHANTIX STARTING MONTH PAK) 0.5 MG X 11 & 1 MG X 42 tablet Take one 0.5 mg tablet by mouth once daily for 3 days, then increase to one 0.5 mg tablet twice daily for 4 days, then increase to one 1 mg tablet twice daily.   No  facility-administered encounter medications on file as of 05/16/2016.     Review of Systems  Constitutional: Negative for appetite change and unexpected weight change.  HENT: Negative for congestion and sinus pressure.   Eyes: Negative for pain and visual disturbance.  Respiratory: Negative for cough, chest tightness and shortness of breath.   Cardiovascular: Negative for chest pain, palpitations and leg swelling.  Gastrointestinal: Negative for abdominal pain, diarrhea, nausea and vomiting.  Genitourinary: Negative for difficulty urinating and dysuria.  Musculoskeletal: Negative for back pain and joint swelling.  Skin: Negative for color change and rash.  Neurological: Negative for dizziness, light-headedness and headaches.  Hematological: Negative for adenopathy. Does not bruise/bleed easily.  Psychiatric/Behavioral: Negative for agitation and dysphoric mood.       Objective:    Physical Exam  Constitutional: She is oriented to person, place, and time. She appears well-developed and well-nourished. No distress.  HENT:  Nose: Nose normal.  Mouth/Throat: Oropharynx is clear and moist.  Eyes: Right eye exhibits no discharge. Left eye exhibits no discharge. No scleral icterus.  Neck: Neck supple. No thyromegaly present.  Cardiovascular: Normal rate and regular rhythm.   Pulmonary/Chest:  Breath sounds normal. No accessory muscle usage. No tachypnea. No respiratory distress. She has no decreased breath sounds. She has no wheezes. She has no rhonchi. Right breast exhibits no inverted nipple, no mass, no nipple discharge and no tenderness (no axillary adenopathy). Left breast exhibits no inverted nipple, no mass, no nipple discharge and no tenderness (no axilarry adenopathy).  Abdominal: Soft. Bowel sounds are normal. There is no tenderness.  Genitourinary:  Genitourinary Comments: Normal external genitalia.  Vaginal vault without lesions.  Cervix identified.  Pap smear performed.  Could not  appreciate any adnexal masses or tenderness.    Musculoskeletal: She exhibits no edema or tenderness.  Lymphadenopathy:    She has no cervical adenopathy.  Neurological: She is alert and oriented to person, place, and time.  Skin: Skin is warm. No rash noted. No erythema.  Psychiatric: She has a normal mood and affect. Her behavior is normal.    BP 110/62 (BP Location: Left Arm, Patient Position: Sitting, Cuff Size: Large)   Pulse 79   Temp 97.8 F (36.6 C) (Oral)   Resp 16   Ht 5' 7.13" (1.705 m)   Wt 153 lb 9.6 oz (69.7 kg)   SpO2 99%   BMI 23.97 kg/m  Wt Readings from Last 3 Encounters:  05/16/16 153 lb 9.6 oz (69.7 kg)  02/13/16 151 lb 6.4 oz (68.7 kg)  01/17/16 143 lb (64.9 kg)     Lab Results  Component Value Date   WBC 8.7 02/21/2016   HGB 13.0 02/21/2016   HCT 38.8 02/21/2016   PLT 330.0 02/21/2016   GLUCOSE 99 02/21/2016   CHOL 185 02/21/2016   TRIG 69.0 02/21/2016   HDL 58.20 02/21/2016   LDLCALC 113 (H) 02/21/2016   ALT 22 02/21/2016   AST 22 02/21/2016   NA 139 02/21/2016   K 4.6 02/21/2016   CL 106 02/21/2016   CREATININE 0.89 02/21/2016   BUN 12 02/21/2016   CO2 27 02/21/2016   TSH 1.37 02/21/2016    US Breast Ltd Uni Left Inc Axilla  Result Date: 01/10/2016 CLINICAL DATA:  The patient complains of a lump in the upper, outer left breast for 1 and half months. In addition, the patient has been having intermittent pain in her left axilla which extends across her superior left breast. She also feels that the superior left breast has been swelling. During the course of her mammogram, a small amount of yellowish discharge was noted from the left nipple. The patient reports that she had yellowish clear nipple discharge up until about 5 years after having her last child 14 years ago. She has not had any spontaneous discharge since that time. EXAM: 2D DIGITAL DIAGNOSTIC BILATERAL MAMMOGRAM WITH CAD AND ADJUNCT TOMO ULTRASOUND LEFT BREAST COMPARISON:  Previous  exam(s). ACR Breast Density Category d: The breast tissue is extremely dense, which lowers the sensitivity of mammography. FINDINGS: There are multiple bilateral oval, circumscribed masses, the largest underlying the palpable marker in the upper, outer left breast measuring 3.3 cm. No suspicious mass, calcifications, or other abnormality is identified within either breast. Mammographic images were processed with CAD. On physical exam, there is an approximately 3.5 cm firm, mobile mass within the left breast at 1 o'clock, 2 cm from the nipple, corresponding to the palpable abnormality. No additional mass is felt in the areas of concern within the superior left breast and axilla. Targeted ultrasound is performed, showing a 3.4 x 1.9 x 2.6 cm simple cyst at 1 o'clock, 2 cm  from the nipple, corresponding to the palpable abnormality. No suspicious cystic or solid sonographic findings were identified in the areas of concern within the superior left breast and axilla. Normal appearing axillary lymph nodes were incidentally noted. Targeted ultrasound of the left retroareolar region demonstrated no intraductal masses or other suspicious findings. IMPRESSION: Left breast cyst. No mammographic or sonographic evidence of malignancy. RECOMMENDATION: 1. Ultrasound-guided aspiration of a left breast cyst at 1 o'clock, 2 cm from the nipple. 2.  Screening mammogram in one year.(Code:SM-B-01Y) I have discussed the findings and recommendations with the patient. Results were also provided in writing at the conclusion of the visit. If applicable, a reminder letter will be sent to the patient regarding the next appointment. BI-RADS CATEGORY  2: Benign. Electronically Signed   By: Pamelia Hoit M.D.   On: 01/10/2016 16:28   Mm Diag Breast Tomo Bilateral  Result Date: 01/10/2016 CLINICAL DATA:  The patient complains of a lump in the upper, outer left breast for 1 and half months. In addition, the patient has been having intermittent pain  in her left axilla which extends across her superior left breast. She also feels that the superior left breast has been swelling. During the course of her mammogram, a small amount of yellowish discharge was noted from the left nipple. The patient reports that she had yellowish clear nipple discharge up until about 5 years after having her last child 14 years ago. She has not had any spontaneous discharge since that time. EXAM: 2D DIGITAL DIAGNOSTIC BILATERAL MAMMOGRAM WITH CAD AND ADJUNCT TOMO ULTRASOUND LEFT BREAST COMPARISON:  Previous exam(s). ACR Breast Density Category d: The breast tissue is extremely dense, which lowers the sensitivity of mammography. FINDINGS: There are multiple bilateral oval, circumscribed masses, the largest underlying the palpable marker in the upper, outer left breast measuring 3.3 cm. No suspicious mass, calcifications, or other abnormality is identified within either breast. Mammographic images were processed with CAD. On physical exam, there is an approximately 3.5 cm firm, mobile mass within the left breast at 1 o'clock, 2 cm from the nipple, corresponding to the palpable abnormality. No additional mass is felt in the areas of concern within the superior left breast and axilla. Targeted ultrasound is performed, showing a 3.4 x 1.9 x 2.6 cm simple cyst at 1 o'clock, 2 cm from the nipple, corresponding to the palpable abnormality. No suspicious cystic or solid sonographic findings were identified in the areas of concern within the superior left breast and axilla. Normal appearing axillary lymph nodes were incidentally noted. Targeted ultrasound of the left retroareolar region demonstrated no intraductal masses or other suspicious findings. IMPRESSION: Left breast cyst. No mammographic or sonographic evidence of malignancy. RECOMMENDATION: 1. Ultrasound-guided aspiration of a left breast cyst at 1 o'clock, 2 cm from the nipple. 2.  Screening mammogram in one year.(Code:SM-B-01Y) I  have discussed the findings and recommendations with the patient. Results were also provided in writing at the conclusion of the visit. If applicable, a reminder letter will be sent to the patient regarding the next appointment. BI-RADS CATEGORY  2: Benign. Electronically Signed   By: Pamelia Hoit M.D.   On: 01/10/2016 16:28       Assessment & Plan:   Problem List Items Addressed This Visit    Breast cyst, left    S/p breast aspiration.  Mammogram 01/10/16 - [facility].        Family history of breast cancer    Mammogram 01/10/16 - [facility].  Health care maintenance    Physical today 05/16/16.  PAP 05/16/16.  Mammogram 01/10/16 - [facility].         Other Visit Diagnoses    Routine cervical smear    -  Primary   Relevant Orders   Cytology - PAP (Completed)       Einar Pheasant, MD

## 2016-05-20 ENCOUNTER — Encounter: Payer: Self-pay | Admitting: *Deleted

## 2016-05-20 LAB — CYTOLOGY - PAP
Diagnosis: NEGATIVE
HPV: NOT DETECTED

## 2016-05-25 NOTE — Assessment & Plan Note (Signed)
Physical today 05/16/16.  PAP 05/16/16.  Mammogram 01/10/16 - [facility].

## 2016-05-25 NOTE — Assessment & Plan Note (Signed)
Mammogram 01/10/16 - [facility].

## 2016-05-25 NOTE — Assessment & Plan Note (Signed)
S/p breast aspiration.  Mammogram 01/10/16 - [facility].

## 2016-06-04 ENCOUNTER — Encounter: Payer: Self-pay | Admitting: Family

## 2016-06-04 ENCOUNTER — Ambulatory Visit (INDEPENDENT_AMBULATORY_CARE_PROVIDER_SITE_OTHER): Payer: PRIVATE HEALTH INSURANCE | Admitting: Family

## 2016-06-04 VITALS — BP 144/80 | HR 80 | Temp 98.1°F | Ht 67.0 in | Wt 152.0 lb

## 2016-06-04 DIAGNOSIS — J111 Influenza due to unidentified influenza virus with other respiratory manifestations: Secondary | ICD-10-CM

## 2016-06-04 LAB — POC INFLUENZA A&B (BINAX/QUICKVUE)
Influenza A, POC: NEGATIVE
Influenza B, POC: POSITIVE — AB

## 2016-06-04 MED ORDER — BENZONATATE 100 MG PO CAPS
100.0000 mg | ORAL_CAPSULE | Freq: Two times a day (BID) | ORAL | 0 refills | Status: DC | PRN
Start: 2016-06-04 — End: 2016-06-12

## 2016-06-04 MED ORDER — OSELTAMIVIR PHOSPHATE 75 MG PO CAPS: 75.0000 mg | ORAL_CAPSULE | Freq: Two times a day (BID) | ORAL | 0 refills | Status: DC

## 2016-06-04 NOTE — Progress Notes (Signed)
Pre visit review using our clinic review tool, if applicable. No additional management support is needed unless otherwise documented below in the visit note. 

## 2016-06-04 NOTE — Progress Notes (Signed)
Subjective:    Patient ID: Kelly Lowery, female    DOB: 1971/04/07, 46 y.o.   MRN: [id number]  CC: Kelly Lowery is a 46 y.o. female who presents today for an acute visit.    HPI: CC: cough, sinus congestion x 3 days, worsening. Started having diarrhea 3 days ago, non bloody. 5 times yesterday, twice today and becoming more formed. Eating bland diet and drinking fluids. No abdominal pain. Some nausea. No vomiting.  Endorses chills, HA, fever  ( tmax 100). Has taken tyelonol, claritin, advil,  with some relief. HA not severe, improved.   No SOB, wheezing, neck pain. Smoker, on chantix.   Lives with daughter and husband - both  [facility] patients. Would like tamiflu called in for them as well.     HISTORY:  Past Medical History:  Diagnosis Date  . Breast cyst    s/p aspiration   Past Surgical History:  Procedure Laterality Date  . CESAREAN SECTION     two  . PARTIAL HYSTERECTOMY  12/30/2013  . TONSILLECTOMY  1974   Family History  Problem Relation Age of Onset  . Breast cancer Maternal Grandmother 47    47 and 60  . Lung cancer Paternal Aunt     Allergies: Keflex [cephalexin] Current Outpatient Prescriptions on File Prior to Visit  Medication Sig Dispense Refill  . aspirin EC 81 MG tablet Take 81 mg by mouth daily.    . Calcium Carbonate-Vit D-Min (CALCIUM 1200 PO) Take by mouth.    . cyanocobalamin 1000 MCG tablet Take 1,000 mcg by mouth daily.    . Multiple Vitamin (MULTIVITAMIN) capsule Take 1 capsule by mouth daily.    . varenicline (CHANTIX CONTINUING MONTH PAK) 1 MG tablet Take 1 tablet (1 mg total) by mouth 2 (two) times daily. 60 tablet 1  . varenicline (CHANTIX STARTING MONTH PAK) 0.5 MG X 11 & 1 MG X 42 tablet Take one 0.5 mg tablet by mouth once daily for 3 days, then increase to one 0.5 mg tablet twice daily for 4 days, then increase to one 1 mg tablet twice daily. 53 tablet 0   No current facility-administered medications on file prior to visit.     Social  History  Substance Use Topics  . Smoking status: Light Tobacco Smoker    Packs/day: 0.50    Years: 20.00    Types: Cigarettes  . Smokeless tobacco: Never Used  . Alcohol use Yes     Comment: occasionally    Review of Systems  Constitutional: Positive for chills and fever.  HENT: Positive for congestion. Negative for rhinorrhea, sinus pain and sore throat.   Respiratory: Positive for cough. Negative for choking, shortness of breath and wheezing.   Cardiovascular: Negative for chest pain and palpitations.  Gastrointestinal: Positive for diarrhea. Negative for abdominal pain, constipation, nausea and vomiting.      Objective:    BP (!) 144/80   Pulse 80   Temp 98.1 F (36.7 C) (Oral)   Ht 5\' 7"  (1.702 m)   Wt 152 lb (68.9 kg)   SpO2 98%   BMI 23.81 kg/m    Physical Exam  Constitutional: She appears well-developed and well-nourished.  HENT:  Head: Normocephalic and atraumatic.  Right Ear: Hearing, tympanic membrane, external ear and ear canal normal. No drainage, swelling or tenderness. No foreign bodies. Tympanic membrane is not erythematous and not bulging. No middle ear effusion. No decreased hearing is noted.  Left Ear: Hearing, tympanic membrane, external ear and  ear canal normal. No drainage, swelling or tenderness. No foreign bodies. Tympanic membrane is not erythematous and not bulging.  No middle ear effusion. No decreased hearing is noted.  Nose: Nose normal. No rhinorrhea. Right sinus exhibits no maxillary sinus tenderness and no frontal sinus tenderness. Left sinus exhibits no maxillary sinus tenderness and no frontal sinus tenderness.  Mouth/Throat: Uvula is midline, oropharynx is clear and moist and mucous membranes are normal. No oropharyngeal exudate, posterior oropharyngeal edema, posterior oropharyngeal erythema or tonsillar abscesses.  Eyes: Conjunctivae are normal.  Cardiovascular: Normal rate, regular rhythm, normal heart sounds and normal pulses.     Pulmonary/Chest: Effort normal and breath sounds normal. She has no wheezes. She has no rhonchi. She has no rales.  Abdominal: Soft. Normal appearance and bowel sounds are normal. She exhibits no distension, no fluid wave, no ascites and no mass. There is no tenderness. There is no rigidity, no rebound, no guarding and no CVA tenderness.  Lymphadenopathy:       Head (right side): No submental, no submandibular, no tonsillar, no preauricular, no posterior auricular and no occipital adenopathy present.       Head (left side): No submental, no submandibular, no tonsillar, no preauricular, no posterior auricular and no occipital adenopathy present.    She has no cervical adenopathy.  Neurological: She is alert.  Skin: Skin is warm and dry.  Psychiatric: She has a normal mood and affect. Her speech is normal and behavior is normal. Thought content normal.  Vitals reviewed.      Assessment & Plan:   1. Influenza Flu positive.Patient is right at 72 hour mark. Since her symptoms are worsening, we jointly decided to go ahead and start Tamiflu. Reassured by benign abdominal exam and absence of any focal tenderness. Diarrhea also appears to be improving. advised bland diet plenty of fluids. Tessalon as needed. Also gave prescriptions for Tamiflu for husband and daughter who are patients of our practice.  - POC Influenza A&B (Binax test) - oseltamivir (TAMIFLU) 75 MG capsule; Take 1 capsule (75 mg total) by mouth 2 (two) times daily.  Dispense: 10 capsule; Refill: 0 - benzonatate (TESSALON) 100 MG capsule; Take 1 capsule (100 mg total) by mouth 2 (two) times daily as needed for cough.  Dispense: 20 capsule; Refill: 0    I am having Ms. Brashier maintain her cyanocobalamin, aspirin EC, multivitamin, Calcium Carbonate-Vit D-Min (CALCIUM 1200 PO), varenicline, and varenicline.   No orders of the defined types were placed in this encounter.   Return precautions given.   Risks, benefits, and  alternatives of the medications and treatment plan prescribed today were discussed, and patient expressed understanding.   Education regarding symptom management and diagnosis given to patient on AVS.  Continue to follow with Einar Pheasant, MD for routine health maintenance.   Kelly Lowery and I agreed with plan.   Mable Paris, FNP

## 2016-06-04 NOTE — Patient Instructions (Signed)
Let me know if diarrhea doesn't continue to improve  This illness can last up to 10 days. Complete medications as directed. Tamiflu on occasion has undesirable side effects including nausea and vomiting; if this occurs, you may stop the medication and continue symptom management at home.   Have all household members treated or placed on prophylaxis medications by contacting their primary care providers. Ensure adequate clear fluid intake. Obtain plenty of rest and provide symptomatic care. Use appropriate dose of Acetaminophen or Ibuprofen as needed for fever and/or pain. 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   Please follow up with our clinic if you develop a fever greater than 101 F, symptoms worsen, or do not resolve in the next week.

## 2016-06-12 ENCOUNTER — Encounter: Payer: Self-pay | Admitting: Family Medicine

## 2016-06-12 ENCOUNTER — Ambulatory Visit (INDEPENDENT_AMBULATORY_CARE_PROVIDER_SITE_OTHER): Payer: PRIVATE HEALTH INSURANCE | Admitting: Family Medicine

## 2016-06-12 ENCOUNTER — Telehealth: Payer: Self-pay | Admitting: Internal Medicine

## 2016-06-12 DIAGNOSIS — J329 Chronic sinusitis, unspecified: Secondary | ICD-10-CM | POA: Insufficient documentation

## 2016-06-12 DIAGNOSIS — J019 Acute sinusitis, unspecified: Secondary | ICD-10-CM | POA: Diagnosis not present

## 2016-06-12 MED ORDER — DOXYCYCLINE HYCLATE 100 MG PO TABS: 100.0000 mg | ORAL_TABLET | Freq: Two times a day (BID) | ORAL | 0 refills | Status: DC

## 2016-06-12 NOTE — Patient Instructions (Signed)
Antibiotic as prescribed.  Feel better.  Dr. Lacinda Axon   Sinusitis, Adult Sinusitis is soreness and inflammation of your sinuses. Sinuses are hollow spaces in the bones around your face. Your sinuses are located:  Around your eyes.  In the middle of your forehead.  Behind your nose.  In your cheekbones. Your sinuses and nasal passages are lined with a stringy fluid (mucus). Mucus normally drains out of your sinuses. When your nasal tissues become inflamed or swollen, the mucus can become trapped or blocked so air cannot flow through your sinuses. This allows bacteria, viruses, and funguses to grow, which leads to infection. Sinusitis can develop quickly and last for 7?10 days (acute) or for more than 12 weeks (chronic). Sinusitis often develops after a cold. What are the causes? This condition is caused by anything that creates swelling in the sinuses or stops mucus from draining, including:  Allergies.  Asthma.  Bacterial or viral infection.  Abnormally shaped bones between the nasal passages.  Nasal growths that contain mucus (nasal polyps).  Narrow sinus openings.  Pollutants, such as chemicals or irritants in the air.  A foreign object stuck in the nose.  A fungal infection. This is rare. What increases the risk? The following factors may make you more likely to develop this condition:  Having allergies or asthma.  Having had a recent cold or respiratory tract infection.  Having structural deformities or blockages in your nose or sinuses.  Having a weak immune system.  Doing a lot of swimming or diving.  Overusing nasal sprays.  Smoking. What are the signs or symptoms? The main symptoms of this condition are pain and a feeling of pressure around the affected sinuses. Other symptoms include:  Upper toothache.  Earache.  Headache.  Bad breath.  Decreased sense of smell and taste.  A cough that may get worse at night.  Fatigue.  Fever.  Thick  drainage from your nose. The drainage is often green and it may contain pus (purulent).  Stuffy nose or congestion.  Postnasal drip. This is when extra mucus collects in the throat or back of the nose.  Swelling and warmth over the affected sinuses.  Sore throat.  Sensitivity to light. How is this diagnosed? This condition is diagnosed based on symptoms, a medical history, and a physical exam. To find out if your condition is acute or chronic, your health care provider may:  Look in your nose for signs of nasal polyps.  Tap over the affected sinus to check for signs of infection.  View the inside of your sinuses using an imaging device that has a light attached (endoscope). If your health care provider suspects that you have chronic sinusitis, you may also:  Be tested for allergies.  Have a sample of mucus taken from your nose (nasal culture) and checked for bacteria.  Have a mucus sample examined to see if your sinusitis is related to an allergy. If your sinusitis does not respond to treatment and it lasts longer than 8 weeks, you may have an MRI or CT scan to check your sinuses. These scans also help to determine how severe your infection is. In rare cases, a bone biopsy may be done to rule out more serious types of fungal sinus disease. How is this treated? Treatment for sinusitis depends on the cause and whether your condition is chronic or acute. If a virus is causing your sinusitis, your symptoms will go away on their own within 10 days. You may be given  medicines to relieve your symptoms, including:  Topical nasal decongestants. They shrink swollen nasal passages and let mucus drain from your sinuses.  Antihistamines. These drugs block inflammation that is triggered by allergies. This can help to ease swelling in your nose and sinuses.  Topical nasal corticosteroids. These are nasal sprays that ease inflammation and swelling in your nose and sinuses.  Nasal saline washes.  These rinses can help to get rid of thick mucus in your nose. If your condition is caused by bacteria, you will be given an antibiotic medicine. If your condition is caused by a fungus, you will be given an antifungal medicine. Surgery may be needed to correct underlying conditions, such as narrow nasal passages. Surgery may also be needed to remove polyps. Follow these instructions at home: Medicines   Take, use, or apply over-the-counter and prescription medicines only as told by your health care provider. These may include nasal sprays.  If you were prescribed an antibiotic medicine, take it as told by your health care provider. Do not stop taking the antibiotic even if you start to feel better. Hydrate and Humidify   Drink enough water to keep your urine clear or pale yellow. Staying hydrated will help to thin your mucus.  Use a cool mist humidifier to keep the humidity level in your home above 50%.  Inhale steam for 10-15 minutes, 3-4 times a day or as told by your health care provider. You can do this in the bathroom while a hot shower is running.  Limit your exposure to cool or dry air. Rest   Rest as much as possible.  Sleep with your head raised (elevated).  Make sure to get enough sleep each night. General instructions   Apply a warm, moist washcloth to your face 3-4 times a day or as told by your health care provider. This will help with discomfort.  Wash your hands often with soap and water to reduce your exposure to viruses and other germs. If soap and water are not available, use hand sanitizer.  Do not smoke. Avoid being around people who are smoking (secondhand smoke).  Keep all follow-up visits as told by your health care provider. This is important. Contact a health care provider if:  You have a fever.  Your symptoms get worse.  Your symptoms do not improve within 10 days. Get help right away if:  You have a severe headache.  You have persistent  vomiting.  You have pain or swelling around your face or eyes.  You have vision problems.  You develop confusion.  Your neck is stiff.  You have trouble breathing. This information is not intended to replace advice given to you by your health care provider. Make sure you discuss any questions you have with your health care provider. Document Released: 03/24/2005 Document Revised: 11/18/2015 Document Reviewed: 01/17/2015 Elsevier Interactive Patient Education  2017 [employer].

## 2016-06-12 NOTE — Telephone Encounter (Signed)
Pt called at 12:27 today. She will be seeing Dr. Lacinda Axon at 4:15 today. She thinks she has a sinus infection.

## 2016-06-12 NOTE — Assessment & Plan Note (Signed)
New acute problem. Treating with Doxycycline.

## 2016-06-12 NOTE — Progress Notes (Signed)
Subjective:  Patient ID: [name], female    DOB: 02/15/1971  Age: 46 y.o. MRN: [id number]  CC: Concern for sinusitis  HPI:  46 year old female presents with the above complaint.  Patient states she had influenza last week. On Monday she developed cough, runny nose, ear pain, congestion, postnasal drip, and severe sinus pressure/pain and associated headache. She reports that she's having some dental pain as well. She reports chills but no documented fever. Symptoms are severe. She's been using Mucinex and Advil with little improvement. She did have some improvement in headache with Excedrin. No reports of significant nasal discharge. No known exacerbating factors. No other associated symptoms. No other complaints or concerns at this time.  Social Hx   Social History   Social History  . Marital status: Married    Spouse name: N/A  . Number of children: N/A  . Years of education: N/A   Social History Main Topics  . Smoking status: Light Tobacco Smoker    Packs/day: 0.50    Years: 20.00    Types: Cigarettes  . Smokeless tobacco: Never Used  . Alcohol use Yes     Comment: occasionally  . Drug use: No  . Sexual activity: Not Asked   Other Topics Concern  . None   Social History Narrative  . None    Review of Systems  Constitutional: Positive for chills.  HENT: Positive for congestion, ear pain, postnasal drip, rhinorrhea, sinus pain and sinus pressure.   Respiratory: Positive for cough.   Neurological: Positive for headaches.    Objective:  BP 137/84   Pulse 76   Temp 98 F (36.7 C) (Oral)   Wt 151 lb 8 oz (68.7 kg)   SpO2 98%   BMI 23.73 kg/m   BP/Weight 06/12/2016 2/69/4854 09/06/7033  Systolic BP 009 381 829  Diastolic BP 84 80 62  Wt. (Lbs) 151.5 152 153.6  BMI 23.73 23.81 23.97    Physical Exam  Constitutional: She is oriented to person, place, and time. She appears well-developed. No distress.  HENT:  Head: Normocephalic and atraumatic.    Mouth/Throat: Oropharynx is clear and moist.  Prior scarring noted on TMs. Significant maxillary and frontal sinus tenderness to palpation.  Neck: Neck supple.  Cardiovascular: Normal rate and regular rhythm.   Pulmonary/Chest: Effort normal and breath sounds normal.  Neurological: She is alert and oriented to person, place, and time.  Psychiatric: She has a normal mood and affect.  Vitals reviewed.   Lab Results  Component Value Date   WBC 8.7 02/21/2016   HGB 13.0 02/21/2016   HCT 38.8 02/21/2016   PLT 330.0 02/21/2016   GLUCOSE 99 02/21/2016   CHOL 185 02/21/2016   TRIG 69.0 02/21/2016   HDL 58.20 02/21/2016   LDLCALC 113 (H) 02/21/2016   ALT 22 02/21/2016   AST 22 02/21/2016   NA 139 02/21/2016   K 4.6 02/21/2016   CL 106 02/21/2016   CREATININE 0.89 02/21/2016   BUN 12 02/21/2016   CO2 27 02/21/2016   TSH 1.37 02/21/2016    Assessment & Plan:   Problem List Items Addressed This Visit    Acute sinusitis    New acute problem. Treating with Doxycycline.      Relevant Medications   doxycycline (VIBRA-TABS) 100 MG tablet      Meds ordered this encounter  Medications  . doxycycline (VIBRA-TABS) 100 MG tablet    Sig: Take 1 tablet (100 mg total) by mouth 2 (two) times daily.  Dispense:  20 tablet    Refill:  0    Follow-up: PRN  [place]

## 2016-06-12 NOTE — Telephone Encounter (Signed)
Pt called about now having some sinus issues. Pt is taking musinex and it's not clearing anything up. Pressure in her sinus cavity. Pt wants to know if she can get some antibiotic?  Pharmacy is [id number] - [address], [place]  Call pt @ [phone]. Thank you!

## 2016-06-12 NOTE — Progress Notes (Signed)
Pre visit review using our clinic review tool, if applicable. No additional management support is needed unless otherwise documented below in the visit note. 

## 2016-06-12 NOTE — Telephone Encounter (Signed)
Reason for call: sinuses  Symptoms:base of neck hurts, pressure in ears, headache forehead, head hurts, congestion, coughing hot/cold  Duration Monday  Medications:Mucinex finished Tamiflu  Last seen for this problem: 06/04/16 diagnosed with flu  Please advise

## 2016-06-13 ENCOUNTER — Telehealth: Payer: Self-pay | Admitting: Internal Medicine

## 2016-06-13 ENCOUNTER — Other Ambulatory Visit: Payer: Self-pay | Admitting: Family Medicine

## 2016-06-13 MED ORDER — LEVOFLOXACIN 500 MG PO TABS: 500.0000 mg | ORAL_TABLET | Freq: Every day | ORAL | 0 refills | Status: DC

## 2016-06-13 NOTE — Telephone Encounter (Signed)
New antibiotic was sent to the pharmacy.

## 2016-06-13 NOTE — Telephone Encounter (Signed)
Pt requested a call, after taking a dose of her antibiotic she starting having a rash through out her body. She requested a different antibiotic. Pt contact [phone]

## 2016-06-13 NOTE — Telephone Encounter (Signed)
Pt called stating that she had started to break out with a rash on her feet and hands. Pt stated it started after starting anti-biotic. Pt would like another anti-biotic. Pt was taking doxycycline 100 mg. Please send RX to [facility] [address].

## 2016-06-13 NOTE — Telephone Encounter (Signed)
Sending in new Rx.

## 2016-06-13 NOTE — Telephone Encounter (Signed)
Please advise for a different antibiotic? thanks

## 2016-06-16 ENCOUNTER — Telehealth: Payer: Self-pay | Admitting: Internal Medicine

## 2016-06-16 MED ORDER — AMOXICILLIN 875 MG PO TABS: 875.0000 mg | ORAL_TABLET | Freq: Two times a day (BID) | ORAL | 0 refills | Status: DC

## 2016-06-16 NOTE — Telephone Encounter (Signed)
Please call pt and see if she has taken pcn or amoxicillin previously.  If so, did she have any problems with this.

## 2016-06-16 NOTE — Telephone Encounter (Signed)
Pt has taken amoxicillin in the past with know problems. She would like to have it sent to [place]

## 2016-06-16 NOTE — Telephone Encounter (Signed)
Please confirm with pt no lip or tongue swelling or difficulty breathing.  I sent in rx for amoxicillin.

## 2016-06-16 NOTE — Telephone Encounter (Signed)
Pt informed will call if any questions she was not having any swelling in the mouth or trouble breathing.

## 2016-06-16 NOTE — Telephone Encounter (Signed)
Pt was seen by Dr. Lacinda Axon on 06/12/16 given doxycycline after taking two times she broke out in rash all over body. Was given a script for Levaquin in 06-13-16 and she states that she started swelling in hands and feet with joint pain so she d/c that after one day as well. She would like to have something called in before the end of the day. She has a flight in a few days and does not want to be sick.

## 2016-06-16 NOTE — Telephone Encounter (Signed)
Pt called about having a sinus infection pt saw Dr Lacinda Axon last week which the medication that was given had given her a major reaction it was antibiotic. Pt is going out of town next week and wants to know if she can get a antibiotic? Please advise? Pt does not want to see any other provider.   Call pt @ [phone]. Thank you!

## 2016-06-18 ENCOUNTER — Encounter: Payer: Self-pay | Admitting: Emergency Medicine

## 2016-06-18 ENCOUNTER — Emergency Department: Payer: 59

## 2016-06-18 ENCOUNTER — Emergency Department
Admission: EM | Admit: 2016-06-18 | Discharge: 2016-06-18 | Disposition: A | Discharge status: Home / Self Care | Payer: 59 | Attending: Emergency Medicine | Admitting: Emergency Medicine

## 2016-06-18 DIAGNOSIS — J209 Acute bronchitis, unspecified: Secondary | ICD-10-CM | POA: Diagnosis not present

## 2016-06-18 DIAGNOSIS — R0789 Other chest pain: Secondary | ICD-10-CM | POA: Diagnosis present

## 2016-06-18 DIAGNOSIS — Z79899 Other long term (current) drug therapy: Secondary | ICD-10-CM | POA: Insufficient documentation

## 2016-06-18 DIAGNOSIS — I10 Essential (primary) hypertension: Secondary | ICD-10-CM | POA: Insufficient documentation

## 2016-06-18 DIAGNOSIS — F17210 Nicotine dependence, cigarettes, uncomplicated: Secondary | ICD-10-CM | POA: Insufficient documentation

## 2016-06-18 DIAGNOSIS — Z7982 Long term (current) use of aspirin: Secondary | ICD-10-CM | POA: Insufficient documentation

## 2016-06-18 DIAGNOSIS — R202 Paresthesia of skin: Secondary | ICD-10-CM | POA: Diagnosis not present

## 2016-06-18 HISTORY — DX: Essential (primary) hypertension: I10

## 2016-06-18 LAB — BASIC METABOLIC PANEL
Anion gap: 6 (ref 5–15)
BUN: 16 mg/dL (ref 6–20)
CO2: 26 mmol/L (ref 22–32)
Calcium: 8.9 mg/dL (ref 8.9–10.3)
Chloride: 107 mmol/L (ref 101–111)
Creatinine, Ser: 0.75 mg/dL (ref 0.44–1.00)
GFR calc Af Amer: >60 mL/min (ref >60)
GFR calc non Af Amer: >60 mL/min (ref >60)
Glucose, Bld: 106 mg/dL — ABNORMAL HIGH (ref 65–99)
Potassium: 4.2 mmol/L (ref 3.5–5.1)
Sodium: 139 mmol/L (ref 135–145)

## 2016-06-18 LAB — CBC
HCT: 39 % (ref 35.0–47.0)
Hemoglobin: 13.6 g/dL (ref 12.0–16.0)
MCH: 31.1 pg (ref 26.0–34.0)
MCHC: 35 g/dL (ref 32.0–36.0)
MCV: 88.8 fL (ref 80.0–100.0)
Platelets: 417 10*3/uL (ref 150–440)
RBC: 4.39 MIL/uL (ref 3.80–5.20)
RDW: 13 % (ref 11.5–14.5)
WBC: 9.6 10*3/uL (ref 3.6–11.0)

## 2016-06-18 LAB — TROPONIN I: Troponin I: <0.03 ng/mL (ref <0.03)

## 2016-06-18 MED ORDER — PREDNISONE 20 MG PO TABS: 40.0000 mg | ORAL_TABLET | Freq: Every day | ORAL | 0 refills | Status: DC

## 2016-06-18 MED ORDER — ALBUTEROL SULFATE HFA 108 (90 BASE) MCG/ACT IN AERS: 2.0000 | INHALATION_SPRAY | Freq: Four times a day (QID) | RESPIRATORY_TRACT | 2 refills | Status: DC | PRN

## 2016-06-18 NOTE — Discharge Instructions (Signed)
We believe that your symptoms are caused today by an exacerbation of possibly bronchitis which may be "left over" after having the flu.  Please take the prescribed medications and any medications that you have at home for your including your amoxicillin. Follow up with your doctor as recommended.  If you develop any new or worsening symptoms, including but not limited to fever, persistent vomiting, worsening shortness of breath, or other symptoms that concern you, please return to the Emergency Department immediately.

## 2016-06-18 NOTE — ED Provider Notes (Signed)
[facility] Emergency Department Provider Note   ____________________________________________   First MD Initiated Contact with Patient 06/18/16 1402     (approximate)  I have reviewed the triage vital signs and the nursing notes.   HISTORY  Chief Complaint Chest Pain    HPI Kelly Lowery is a 46 y.o. female reports previously healthy, but did develop "the flu" about 3 weeks ago. Since then she's been slow to recover reports fatigue daily, sinus congestion has been treated with 2 antibiotics now for a sinus infection. She was initially started on doxycycline, but developed a rash, swelling in her hands legs joint aches and pain that a tingling sensation in both hands and arms after starting doxycycline, which she then stopped*Levaquin a few days ago, but this was causing stomach upset and was thus discontinued, and she has now been continuing on amoxicillin which she is tolerating well.  Today she noticed that the tingling feeling in her arms seem to worsen, she had a feeling of a tight this or discomfort while resting on the couch this morning. This prompted her to come the ER for further evaluation.  No headache,no weakness in the hands or arms or face. No facial droop. No trouble walking. Reports she is able to use her hands walk and speak just fine. No neck pain  Patient reports all symptoms have gone away now, she does take a baby aspirin daily.  She denies ever having any "chest pain". She reports it felt more like a discomfort up by her left shoulder that went away.  Past Medical History:  Diagnosis Date  . Breast cyst    s/p aspiration  . Hypertension     Patient Active Problem List   Diagnosis Date Noted  . Acute sinusitis 06/12/2016  . Sleeping difficulties 02/14/2016  . Family history of breast cancer 01/21/2016  . Health care maintenance 01/21/2016  . Breast cyst, left 01/17/2016    Past Surgical History:  Procedure Laterality Date    . CESAREAN SECTION     two  . PARTIAL HYSTERECTOMY  12/30/2013  . TONSILLECTOMY  1974    Prior to Admission medications   Medication Sig Start Date End Date Taking? Authorizing Provider  albuterol (PROVENTIL HFA;VENTOLIN HFA) 108 (90 Base) MCG/ACT inhaler Inhale 2 puffs into the lungs every 6 (six) hours as needed for wheezing or shortness of breath. 06/18/16   Delman Kitten, MD  amoxicillin (AMOXIL) 875 MG tablet Take 1 tablet (875 mg total) by mouth 2 (two) times daily. 06/16/16   Einar Pheasant, MD  aspirin EC 81 MG tablet Take 81 mg by mouth daily.    Historical Provider, MD  Calcium Carbonate-Vit D-Min (CALCIUM 1200 PO) Take by mouth.    Historical Provider, MD  cyanocobalamin 1000 MCG tablet Take 1,000 mcg by mouth daily.    Historical Provider, MD  Multiple Vitamin (MULTIVITAMIN) capsule Take 1 capsule by mouth daily.    Historical Provider, MD  predniSONE (DELTASONE) 20 MG tablet Take 2 tablets (40 mg total) by mouth daily. 06/18/16   Delman Kitten, MD  varenicline (CHANTIX CONTINUING MONTH PAK) 1 MG tablet Take 1 tablet (1 mg total) by mouth 2 (two) times daily. 05/16/16   Einar Pheasant, MD  varenicline (CHANTIX STARTING MONTH PAK) 0.5 MG X 11 & 1 MG X 42 tablet Take one 0.5 mg tablet by mouth once daily for 3 days, then increase to one 0.5 mg tablet twice daily for 4 days, then increase to one 1  mg tablet twice daily. 05/16/16   Einar Pheasant, MD    Allergies Levaquin [levofloxacin] and Keflex [cephalexin]  Family History  Problem Relation Age of Onset  . Breast cancer Maternal Grandmother 47    47 and 60  . Lung cancer Paternal Aunt     Social History Social History  Substance Use Topics  . Smoking status: Light Tobacco Smoker    Packs/day: 0.50    Years: 20.00    Types: Cigarettes  . Smokeless tobacco: Never Used  . Alcohol use Yes     Comment: occasionally   Denies any personal history of heart disease. She reports her dad had heart problems later in life, but ultimately  died of the fluid.  Review of Systems Constitutional: No fever/chills Eyes: No visual changes. ENT: No sore throat. Reports sinus congestion Cardiovascular: Denies chest pain. Respiratory: Denies shortness of breath. Frequent dry cough for the last couple weeks. Gastrointestinal: No abdominal pain.  No nausea, no vomiting.  Genitourinary: Negative for dysuria. Musculoskeletal: Negative for back pain. Skin: Negative for rash. Neurological: Negative for headaches, focal weakness or numbness.  10-point ROS otherwise negative.  ____________________________________________   PHYSICAL EXAM:  VITAL SIGNS: ED Triage Vitals  Enc Vitals Group     BP 06/18/16 1135 133/87     Pulse Rate 06/18/16 1135 77     Resp 06/18/16 1135 18     Temp 06/18/16 1135 97.7 F (36.5 C)     Temp Source 06/18/16 1135 Oral     SpO2 06/18/16 1135 97 %     Weight 06/18/16 1135 151 lb (68.5 kg)     Height 06/18/16 1135 5' 6.5" (1.689 m)     Head Circumference --      Peak Flow --      Pain Score 06/18/16 1136 2     Pain Loc --      Pain Edu? --      Excl. in [place]? --     Constitutional: Alert and oriented. Well appearing and in no acute distress. Eyes: Conjunctivae are normal. PERRL. EOMI. Head: Atraumatic. Nose: No congestion/rhinnorhea. Mouth/Throat: Mucous membranes are moist.  Oropharynx non-erythematous. Neck: No stridor.  No meningismus Cardiovascular: Normal rate, regular rhythm. Grossly normal heart sounds.  Good peripheral circulation. Respiratory: Normal respiratory effort.  No retractions. Lungs CTAB. Speaks in full clear sentences. Gastrointestinal: Soft and nontender. No distention.  Musculoskeletal: No lower extremity tenderness nor edema.  No joint effusions. Neurologic:  Normal speech and language. No gross focal neurologic deficits are appreciated.   The patient has no pronator drift. The patient has normal cranial nerve exam. Extraocular movements are normal. Visual fields are  normal. Patient has 5 out of 5 strength in all extremities. There is no numbness or gross, acute sensory abnormality in the extremities bilaterally. No speech disturbance. No dysarthria. No aphasia. No ataxia. Patient speaking in full and clear sentences.   Skin:  Skin is warm, dry and intact. No rash noted. Psychiatric: Mood and affect are normal. Speech and behavior are normal.  ____________________________________________   LABS (all labs ordered are listed, but only abnormal results are displayed)  Labs Reviewed  BASIC METABOLIC PANEL - Abnormal; Notable for the following:       Result Value   Glucose, Bld 106 (*)    All other components within normal limits  CBC  TROPONIN I   ____________________________________________  EKG  Reviewed and interpreted by me at 11:30 AM Ventricle rate 70 QRS 80 QTc 420 Normal  sinus rhythm, abnormal R-wave progression in V1 through V3, nonspecific but could represent an old ischemic abnormality. No evidence of active ischemia. The patient has not had a previous EKG for review in our system. ____________________________________________  RADIOLOGY  Dg Chest 2 View  Result Date: 06/18/2016 CLINICAL DATA:  Chest pain. EXAM: CHEST  2 VIEW COMPARISON:  08/28/2011. FINDINGS: Mediastinum and hilar structures are normal. Lungs are clear. No pleural effusion or pneumothorax. No acute bony abnormality. IMPRESSION: No acute cardiopulmonary disease. Electronically Signed   By: Marcello Moores  Register   On: 06/18/2016 12:57    ____________________________________________   PROCEDURES  Procedure(s) performed: None  Procedures  Critical Care performed: No  ____________________________________________   INITIAL IMPRESSION / ASSESSMENT AND PLAN / ED COURSE  Pertinent labs & imaging results that were available during my care of the patient were reviewed by me and considered in my medical decision making (see chart for details).  Patient reports  paresthesias that began several days ago after starting doxycycline, seemed worse today over the left forearm that has since gone away and she denies that there is any dense loss of sensation, reports rather a tingling feeling and was in the left hand and reports she's had the same in both hands off and on for a few days since being on doxycycline. Reassuring and normal neurologic exam. She denies any chest pain. No risk factors for acute coronary disease, other than smoking status. Reassuring twelve-lead with no ischemic changes noted. Normal troponin. The patient is low risk by heart score.  Currently not having any active discomfort in the chest. I suspect likely based on her chronic dry cough, recent illness that she is suffering somewhat from ongoing bronchitis and agree with her treatment on amoxicillin, but discussed with her and will add prednisone for 5 day course as well as an albuterol inhaler. Careful return precautions as well as strong recommendations to follow up with her primary work discussed with the patient and her family were both in agreement.  Return precautions and treatment recommendations and follow-up discussed with the patient who is agreeable with the plan.       ____________________________________________   FINAL CLINICAL IMPRESSION(S) / ED DIAGNOSES  Final diagnoses:  Acute bronchitis, unspecified organism  Paresthesia      NEW MEDICATIONS STARTED DURING THIS VISIT:  New Prescriptions   ALBUTEROL (PROVENTIL HFA;VENTOLIN HFA) 108 (90 BASE) MCG/ACT INHALER    Inhale 2 puffs into the lungs every 6 (six) hours as needed for wheezing or shortness of breath.   PREDNISONE (DELTASONE) 20 MG TABLET    Take 2 tablets (40 mg total) by mouth daily.     Note:  This document was prepared using Dragon voice recognition software and may include unintentional dictation errors.     Delman Kitten, MD 06/18/16 1520

## 2016-06-18 NOTE — ED Triage Notes (Signed)
Pt presents to ED c/o L-sided chest pain 2/10 starting today, also c/o central chest pain which pt states is heartburn. C/o L arm tingling. Denies nunbness, no facial droop/slurred speech, no extremity weakness. Chest pain presents with lightheadedness/dizzziness and nausea. Pt recently treated for flu and sinus infection, currently on abt.

## 2016-08-07 ENCOUNTER — Ambulatory Visit: Payer: 59 | Admitting: Internal Medicine

## 2016-09-11 ENCOUNTER — Encounter: Payer: Self-pay | Admitting: Family

## 2016-09-11 ENCOUNTER — Ambulatory Visit (INDEPENDENT_AMBULATORY_CARE_PROVIDER_SITE_OTHER): Payer: 59 | Admitting: Family

## 2016-09-11 VITALS — BP 112/70 | HR 73 | Temp 98.3°F | Ht 66.5 in | Wt 154.6 lb

## 2016-09-11 DIAGNOSIS — R8290 Unspecified abnormal findings in urine: Secondary | ICD-10-CM

## 2016-09-11 LAB — POCT URINALYSIS DIPSTICK
Bilirubin, UA: NEGATIVE
Blood, UA: NEGATIVE
Glucose, UA: NEGATIVE
Ketones, UA: NEGATIVE
Leukocytes, UA: NEGATIVE
Nitrite, UA: NEGATIVE
Protein, UA: NEGATIVE
Spec Grav, UA: 1.01 (ref 1.010–1.025)
Urobilinogen, UA: 0.2 E.U./dL
pH, UA: 7 (ref 5.0–8.0)

## 2016-09-11 LAB — URINALYSIS, MICROSCOPIC ONLY
RBC / HPF: NONE SEEN (ref 0–?)
WBC, UA: NONE SEEN (ref 0–?)

## 2016-09-11 NOTE — Progress Notes (Signed)
Subjective:    Patient ID: Kelly Lowery, female    DOB: 05-08-1970, 46 y.o.   MRN: [id number]  CC: Kelly Lowery is a 46 y.o. female who presents today for an acute visit.    HPI: CC: 'seeds in urine', noticed 4 days ago, resolved. describes as 'black dots.'  hasn't seen in urine in the past couple of days. No dysuria.   Suprapubic cramping 'like Im starting period', 10 days, comes and goes. None yesterday.  Breasts sore, hot flashes. Still feels mood changes which are cyclic. No abdominal pain, N, V, fever, vaginal bleeding.   Eating and drinking normally.   H/o partial hysterectomy 3 years ago. Still has ovaries.   H/o renal stone.   NO concern for stds.    HISTORY:  Past Medical History:  Diagnosis Date  . Breast cyst    s/p aspiration  . Hypertension    Past Surgical History:  Procedure Laterality Date  . CESAREAN SECTION     two  . PARTIAL HYSTERECTOMY  12/30/2013  . TONSILLECTOMY  1974   Family History  Problem Relation Age of Onset  . Breast cancer Maternal Grandmother 47       47 and 60  . Lung cancer Paternal Aunt     Allergies: Levaquin [levofloxacin] and Keflex [cephalexin] Current Outpatient Prescriptions on File Prior to Visit  Medication Sig Dispense Refill  . albuterol (PROVENTIL HFA;VENTOLIN HFA) 108 (90 Base) MCG/ACT inhaler Inhale 2 puffs into the lungs every 6 (six) hours as needed for wheezing or shortness of breath. 1 Inhaler 2  . amoxicillin (AMOXIL) 875 MG tablet Take 1 tablet (875 mg total) by mouth 2 (two) times daily. 20 tablet 0  . aspirin EC 81 MG tablet Take 81 mg by mouth daily.    . Calcium Carbonate-Vit D-Min (CALCIUM 1200 PO) Take by mouth.    . cyanocobalamin 1000 MCG tablet Take 1,000 mcg by mouth daily.    . Multiple Vitamin (MULTIVITAMIN) capsule Take 1 capsule by mouth daily.    . predniSONE (DELTASONE) 20 MG tablet Take 2 tablets (40 mg total) by mouth daily. 10 tablet 0  . varenicline (CHANTIX CONTINUING MONTH PAK) 1 MG  tablet Take 1 tablet (1 mg total) by mouth 2 (two) times daily. 60 tablet 1  . varenicline (CHANTIX STARTING MONTH PAK) 0.5 MG X 11 & 1 MG X 42 tablet Take one 0.5 mg tablet by mouth once daily for 3 days, then increase to one 0.5 mg tablet twice daily for 4 days, then increase to one 1 mg tablet twice daily. 53 tablet 0   No current facility-administered medications on file prior to visit.     Social History  Substance Use Topics  . Smoking status: Light Tobacco Smoker    Packs/day: 0.50    Years: 20.00    Types: Cigarettes  . Smokeless tobacco: Never Used  . Alcohol use Yes     Comment: occasionally    Review of Systems  Constitutional: Negative for chills and fever.  Respiratory: Negative for cough.   Cardiovascular: Negative for chest pain and palpitations.  Gastrointestinal: Negative for abdominal distention, abdominal pain, blood in stool, constipation, nausea and vomiting.  Genitourinary: Positive for pelvic pain. Negative for dyspareunia, flank pain, frequency, hematuria, vaginal discharge and vaginal pain.      Objective:    BP 112/70   Pulse 73   Temp 98.3 F (36.8 C) (Oral)   Ht 5' 6.5" (1.689 m)   Wt 154  lb 9.6 oz (70.1 kg)   SpO2 97%   BMI 24.58 kg/m    Physical Exam  Constitutional: She appears well-developed and well-nourished.  Eyes: Conjunctivae are normal.  Cardiovascular: Normal rate, regular rhythm, normal heart sounds and normal pulses.   Pulmonary/Chest: Effort normal and breath sounds normal. She has no wheezes. She has no rhonchi. She has no rales.  Abdominal: Soft. Normal appearance and bowel sounds are normal. She exhibits no distension, no fluid wave, no ascites and no mass. There is no tenderness. There is no rigidity, no rebound, no guarding and no CVA tenderness.  Neurological: She is alert.  Skin: Skin is warm and dry.  Psychiatric: She has a normal mood and affect. Her speech is normal and behavior is normal. Thought content normal.    Vitals reviewed.      Assessment & Plan:   1. Abnormal finding in urine Urine is negative for nitrites, bacteria, blood. Pending urine culture, micro. Suprapubic cramping is not specific at this time as patient has had a partial hysterectomy. The cramping is bilateral. Reassured by benign abdominal exam. Discussed with patient the role imaging; she politely declined and felt comfortable giving this more time. I think that is appropriate. Patient will let me know if cramping changes in intensity or new symptoms develop; we will order a CT abdomen at that time.    - POCT Urinalysis Dipstick - Urine culture - Urine Microscopic    I am having Ms. Keil maintain her cyanocobalamin, aspirin EC, multivitamin, Calcium Carbonate-Vit D-Min (CALCIUM 1200 PO), varenicline, varenicline, amoxicillin, predniSONE, and albuterol.   No orders of the defined types were placed in this encounter.   Return precautions given.   Risks, benefits, and alternatives of the medications and treatment plan prescribed today were discussed, and patient expressed understanding.   Education regarding symptom management and diagnosis given to patient on AVS.  Continue to follow with Einar Pheasant, MD for routine health maintenance.   Kelly Lowery and I agreed with plan.   Mable Paris, FNP

## 2016-09-11 NOTE — Progress Notes (Signed)
Pre visit review using our clinic review tool, if applicable. No additional management support is needed unless otherwise documented below in the visit note. 

## 2016-09-11 NOTE — Patient Instructions (Signed)
Lets await urine as discussed As also discussed, we are not imaging today and will give symptoms more time  . Please stay very very vigilant and let me know if any new symptoms develop or if cramping does not resolve completely on its own.

## 2016-09-13 LAB — URINE CULTURE

## 2016-11-27 ENCOUNTER — Other Ambulatory Visit (HOSPITAL_COMMUNITY)
Admission: RE | Admit: 2016-11-27 | Discharge: 2016-11-27 | Disposition: A | Discharge status: Home / Self Care | Payer: 59 | Source: Ambulatory Visit | Attending: Internal Medicine | Admitting: Internal Medicine

## 2016-11-27 ENCOUNTER — Ambulatory Visit (INDEPENDENT_AMBULATORY_CARE_PROVIDER_SITE_OTHER): Payer: 59 | Admitting: Internal Medicine

## 2016-11-27 ENCOUNTER — Encounter: Payer: Self-pay | Admitting: Internal Medicine

## 2016-11-27 VITALS — BP 114/70 | HR 83 | Temp 98.0°F | Ht 67.0 in | Wt 151.2 lb

## 2016-11-27 DIAGNOSIS — R102 Pelvic and perineal pain: Secondary | ICD-10-CM | POA: Diagnosis not present

## 2016-11-27 DIAGNOSIS — R1030 Lower abdominal pain, unspecified: Secondary | ICD-10-CM | POA: Diagnosis not present

## 2016-11-27 DIAGNOSIS — B9689 Other specified bacterial agents as the cause of diseases classified elsewhere: Secondary | ICD-10-CM | POA: Diagnosis not present

## 2016-11-27 DIAGNOSIS — R109 Unspecified abdominal pain: Secondary | ICD-10-CM | POA: Diagnosis not present

## 2016-11-27 DIAGNOSIS — N898 Other specified noninflammatory disorders of vagina: Secondary | ICD-10-CM

## 2016-11-27 LAB — BASIC METABOLIC PANEL
BUN: 12 mg/dL (ref 6–23)
CO2: 31 mEq/L (ref 19–32)
Calcium: 9.6 mg/dL (ref 8.4–10.5)
Chloride: 105 mEq/L (ref 96–112)
Creatinine, Ser: 0.81 mg/dL (ref 0.40–1.20)
GFR: 80.11 mL/min (ref >60.00)
Glucose, Bld: 88 mg/dL (ref 70–99)
Potassium: 4 mEq/L (ref 3.5–5.1)
Sodium: 139 mEq/L (ref 135–145)

## 2016-11-27 LAB — CBC WITH DIFFERENTIAL/PLATELET
Basophils Absolute: 0.1 10*3/uL (ref 0.0–0.1)
Basophils Relative: 1.2 % (ref 0.0–3.0)
Eosinophils Absolute: 0.2 10*3/uL (ref 0.0–0.7)
Eosinophils Relative: 2.3 % (ref 0.0–5.0)
HCT: 41.4 % (ref 36.0–46.0)
Hemoglobin: 13.8 g/dL (ref 12.0–15.0)
Lymphocytes Relative: 40.3 % (ref 12.0–46.0)
Lymphs Abs: 3.4 10*3/uL (ref 0.7–4.0)
MCHC: 33.4 g/dL (ref 30.0–36.0)
MCV: 93.7 fl (ref 78.0–100.0)
Monocytes Absolute: 0.7 10*3/uL (ref 0.1–1.0)
Monocytes Relative: 8.5 % (ref 3.0–12.0)
Neutro Abs: 4.1 10*3/uL (ref 1.4–7.7)
Neutrophils Relative %: 47.7 % (ref 43.0–77.0)
Platelets: 338 10*3/uL (ref 150.0–400.0)
RBC: 4.42 Mil/uL (ref 3.87–5.11)
RDW: 14 % (ref 11.5–15.5)
WBC: 8.6 10*3/uL (ref 4.0–10.5)

## 2016-11-27 LAB — HEPATIC FUNCTION PANEL
ALT: 18 U/L (ref 0–35)
AST: 16 U/L (ref 0–37)
Albumin: 4.3 g/dL (ref 3.5–5.2)
Alkaline Phosphatase: 75 U/L (ref 39–117)
Bilirubin, Direct: 0.1 mg/dL (ref 0.0–0.3)
Total Bilirubin: 0.3 mg/dL (ref 0.2–1.2)
Total Protein: 7.2 g/dL (ref 6.0–8.3)

## 2016-11-27 NOTE — Progress Notes (Signed)
Pre-visit discussion using our clinic review tool. No additional management support is needed unless otherwise documented below in the visit note.  

## 2016-11-27 NOTE — Progress Notes (Signed)
Patient ID: Kelly Lowery, female   DOB: Dec 15, 1970, 46 y.o.   MRN: [id number]   Subjective:    Patient ID: Kelly Lowery, female    DOB: 02-10-1971, 46 y.o.   MRN: [id number]  HPI  Patient here as an acute visit to discuss some persistent lower abdominal/pelvic pain.  She reports that starting a couple of months ago, she started having some breast tenderness.  Was intermittent.  This has resolved.  She then developed some lower abdominal/pelvic cramping.  Is s/p hysterectomy, but states feels like it did when she had menstrual cramps.  Reports now is more constant.  Describes a dull sensation.  Aggravated at times with palpation.  Some acid reflux.  No vomiting or nausea.  No dysuria.  Does report a vaginal odor at times.  Bowels moving q day.  No blood.  No vaginal bleeding.     Past Medical History:  Diagnosis Date  . Breast cyst    s/p aspiration  . Hypertension    Past Surgical History:  Procedure Laterality Date  . CESAREAN SECTION     two  . PARTIAL HYSTERECTOMY  12/30/2013  . TONSILLECTOMY  1974   Family History  Problem Relation Age of Onset  . Breast cancer Maternal Grandmother 47       47 and 60  . Lung cancer Paternal Aunt    Social History   Social History  . Marital status: Married    Spouse name: N/A  . Number of children: N/A  . Years of education: N/A   Social History Main Topics  . Smoking status: Light Tobacco Smoker    Packs/day: 0.50    Years: 20.00    Types: Cigarettes  . Smokeless tobacco: Never Used  . Alcohol use Yes     Comment: occasionally  . Drug use: No  . Sexual activity: Not Asked   Other Topics Concern  . None   Social History Narrative  . None    Outpatient Encounter Prescriptions as of 11/27/2016  Medication Sig  . albuterol (PROVENTIL HFA;VENTOLIN HFA) 108 (90 Base) MCG/ACT inhaler Inhale 2 puffs into the lungs every 6 (six) hours as needed for wheezing or shortness of breath.  Marland Kitchen amoxicillin (AMOXIL) 875 MG tablet Take 1  tablet (875 mg total) by mouth 2 (two) times daily.  Marland Kitchen aspirin EC 81 MG tablet Take 81 mg by mouth daily.  . Calcium Carbonate-Vit D-Min (CALCIUM 1200 PO) Take by mouth.  . cyanocobalamin 1000 MCG tablet Take 1,000 mcg by mouth daily.  . Multiple Vitamin (MULTIVITAMIN) capsule Take 1 capsule by mouth daily.  . [DISCONTINUED] predniSONE (DELTASONE) 20 MG tablet Take 2 tablets (40 mg total) by mouth daily.  . [DISCONTINUED] varenicline (CHANTIX CONTINUING MONTH PAK) 1 MG tablet Take 1 tablet (1 mg total) by mouth 2 (two) times daily.  . [DISCONTINUED] varenicline (CHANTIX STARTING MONTH PAK) 0.5 MG X 11 & 1 MG X 42 tablet Take one 0.5 mg tablet by mouth once daily for 3 days, then increase to one 0.5 mg tablet twice daily for 4 days, then increase to one 1 mg tablet twice daily.   No facility-administered encounter medications on file as of 11/27/2016.     Review of Systems  Constitutional: Negative for appetite change and unexpected weight change.  Respiratory: Negative for cough, chest tightness and shortness of breath.   Cardiovascular: Negative for chest pain, palpitations and leg swelling.  Gastrointestinal: Positive for abdominal pain. Negative for diarrhea, nausea and vomiting.  Genitourinary:  Negative for difficulty urinating, dysuria and vaginal discharge.       Vaginal odor as outlined.    Musculoskeletal: Negative for back pain and myalgias.  Skin: Negative for color change and rash.  Neurological: Negative for dizziness, light-headedness and headaches.  Psychiatric/Behavioral: Negative for agitation and dysphoric mood.       Objective:    Physical Exam  Constitutional: She appears well-developed and well-nourished. No distress.  HENT:  Nose: Nose normal.  Mouth/Throat: Oropharynx is clear and moist.  Neck: Neck supple. No thyromegaly present.  Cardiovascular: Normal rate and regular rhythm.   Pulmonary/Chest: Breath sounds normal. No respiratory distress. She has no  wheezes.  Abdominal: Soft. Bowel sounds are normal. There is no tenderness.  Genitourinary:  Genitourinary Comments: Normal external genitalia.  Vaginal vault without lesions.  Discharge present.  KOH/wet prep sent.  Could not appreciate any adnexal masses or tenderness.    Musculoskeletal: She exhibits no edema or tenderness.  Lymphadenopathy:    She has no cervical adenopathy.  Skin: No rash noted. No erythema.  Psychiatric: She has a normal mood and affect. Her behavior is normal.    BP 114/70 (BP Location: Left Arm, Patient Position: Sitting, Cuff Size: Normal)   Pulse 83   Temp 98 F (36.7 C) (Oral)   Ht 5\' 7"  (1.702 m)   Wt 151 lb 3.2 oz (68.6 kg)   SpO2 98%   BMI 23.68 kg/m  Wt Readings from Last 3 Encounters:  11/27/16 151 lb 3.2 oz (68.6 kg)  09/11/16 154 lb 9.6 oz (70.1 kg)  06/18/16 151 lb (68.5 kg)     Lab Results  Component Value Date   WBC 8.6 11/27/2016   HGB 13.8 11/27/2016   HCT 41.4 11/27/2016   PLT 338.0 11/27/2016   GLUCOSE 88 11/27/2016   CHOL 185 02/21/2016   TRIG 69.0 02/21/2016   HDL 58.20 02/21/2016   LDLCALC 113 (H) 02/21/2016   ALT 18 11/27/2016   AST 16 11/27/2016   NA 139 11/27/2016   K 4.0 11/27/2016   CL 105 11/27/2016   CREATININE 0.81 11/27/2016   BUN 12 11/27/2016   CO2 31 11/27/2016   TSH 1.37 02/21/2016    Dg Chest 2 View  Result Date: 06/18/2016 CLINICAL DATA:  Chest pain. EXAM: CHEST  2 VIEW COMPARISON:  08/28/2011. FINDINGS: Mediastinum and hilar structures are normal. Lungs are clear. No pleural effusion or pneumothorax. No acute bony abnormality. IMPRESSION: No acute cardiopulmonary disease. Electronically Signed   By: Marcello Moores  Register   On: 06/18/2016 12:57       Assessment & Plan:   Problem List Items Addressed This Visit    Lower abdominal pain    Persistent lower abdominal pain and cramping as outlined. Exam as outlined.  KOH/wet prep obtained.  Check routine labs.  Schedule pelvic ultrasound.        Relevant  Orders   US Transvaginal Non-OB    Other Visit Diagnoses    Abdominal pain, unspecified abdominal location    -  Primary   Relevant Orders   CBC with Differential/Platelet (Completed)   Hepatic function panel (Completed)   Basic metabolic panel (Completed)   Vaginal discharge       Relevant Orders   Cervicovaginal ancillary only   Vaginal odor       KOH/wet prep obtained.     Pelvic pain       Relevant Orders   US Transvaginal Non-OB       Garrus Gauthreaux,  MD

## 2016-11-28 ENCOUNTER — Telehealth: Payer: Self-pay | Admitting: *Deleted

## 2016-11-28 NOTE — Telephone Encounter (Signed)
See lab results.  

## 2016-11-28 NOTE — Telephone Encounter (Signed)
Pt requested lab results  Pt contact [phone]

## 2016-11-29 ENCOUNTER — Encounter: Payer: Self-pay | Admitting: Internal Medicine

## 2016-11-29 DIAGNOSIS — R1030 Lower abdominal pain, unspecified: Secondary | ICD-10-CM | POA: Insufficient documentation

## 2016-11-29 NOTE — Assessment & Plan Note (Signed)
Persistent lower abdominal pain and cramping as outlined. Exam as outlined.  KOH/wet prep obtained.  Check routine labs.  Schedule pelvic ultrasound.

## 2016-12-01 LAB — CERVICOVAGINAL ANCILLARY ONLY: Wet Prep (BD Affirm): POSITIVE — AB

## 2016-12-02 ENCOUNTER — Other Ambulatory Visit: Payer: Self-pay

## 2016-12-02 MED ORDER — METRONIDAZOLE 0.75 % VA GEL: 1.0000 | Freq: Two times a day (BID) | VAGINAL | 0 refills | Status: AC

## 2016-12-09 ENCOUNTER — Telehealth: Payer: Self-pay | Admitting: *Deleted

## 2016-12-09 DIAGNOSIS — R1030 Lower abdominal pain, unspecified: Secondary | ICD-10-CM

## 2016-12-09 NOTE — Telephone Encounter (Signed)
Pt requested a update on her ultra sound referral  Pt contact [phone]

## 2016-12-10 ENCOUNTER — Other Ambulatory Visit: Payer: Self-pay | Admitting: Internal Medicine

## 2016-12-10 DIAGNOSIS — R1030 Lower abdominal pain, unspecified: Secondary | ICD-10-CM

## 2016-12-10 NOTE — Progress Notes (Signed)
Order placed for pelvic ultrasound.

## 2016-12-10 NOTE — Telephone Encounter (Signed)
Order placed for ultrasound

## 2016-12-10 NOTE — Telephone Encounter (Signed)
Dr. Gildardo Cranker know I just sent you a message to add an ultrasound for this patient. Now they have an order that takes the place of both transvaginal and complete ultrasound. Can you now order [id number] for me? I am going to send a message out to everyone that can place orders.

## 2016-12-12 ENCOUNTER — Ambulatory Visit
Admission: RE | Admit: 2016-12-12 | Discharge: 2016-12-12 | Disposition: A | Discharge status: Home / Self Care | Payer: 59 | Source: Ambulatory Visit | Attending: Internal Medicine | Admitting: Internal Medicine

## 2016-12-12 ENCOUNTER — Other Ambulatory Visit: Payer: Self-pay | Admitting: Internal Medicine

## 2016-12-12 DIAGNOSIS — R1030 Lower abdominal pain, unspecified: Secondary | ICD-10-CM | POA: Insufficient documentation

## 2016-12-12 DIAGNOSIS — N83201 Unspecified ovarian cyst, right side: Secondary | ICD-10-CM | POA: Insufficient documentation

## 2016-12-16 ENCOUNTER — Other Ambulatory Visit: Payer: Self-pay | Admitting: Internal Medicine

## 2016-12-16 DIAGNOSIS — R1030 Lower abdominal pain, unspecified: Secondary | ICD-10-CM

## 2016-12-16 DIAGNOSIS — N83201 Unspecified ovarian cyst, right side: Secondary | ICD-10-CM

## 2016-12-16 NOTE — Progress Notes (Signed)
Order placed for gyn referral.  

## 2016-12-29 ENCOUNTER — Telehealth: Payer: Self-pay | Admitting: *Deleted

## 2016-12-29 NOTE — Telephone Encounter (Signed)
Pt requested a update on the process for the gynecology appt Pt contact  [phone]

## 2016-12-31 ENCOUNTER — Telehealth: Payer: Self-pay | Admitting: Family

## 2016-12-31 DIAGNOSIS — R05 Cough: Secondary | ICD-10-CM

## 2016-12-31 DIAGNOSIS — R059 Cough, unspecified: Secondary | ICD-10-CM

## 2016-12-31 MED ORDER — HYDROCODONE-HOMATROPINE 5-1.5 MG/5ML PO SYRP: 5.0000 mL | ORAL_SOLUTION | Freq: Every evening | ORAL | 0 refills | Status: DC | PRN

## 2016-12-31 NOTE — Telephone Encounter (Signed)
   Coughing x 2 of weeks, improving. Nasal congestion.  Has been taking nyquil, tessalon and cannot tell if helped.  Has an inhaler at home- hasn't used.   Here with daughter today Sharolyn Douglas) and would like something for cough, worse at bedtime.   No CP, wheezing, SOb  No fever.   No asthma.   Agreed to give hycodan cough syrup and advised to use inhaler as she has in the past for cough.   Patient will follow up with me if not improving.

## 2017-02-03 ENCOUNTER — Encounter: Payer: Self-pay | Admitting: Obstetrics and Gynecology

## 2017-02-05 ENCOUNTER — Ambulatory Visit (INDEPENDENT_AMBULATORY_CARE_PROVIDER_SITE_OTHER): Payer: 59 | Admitting: Internal Medicine

## 2017-02-05 ENCOUNTER — Encounter: Payer: Self-pay | Admitting: Internal Medicine

## 2017-02-05 VITALS — BP 108/78 | HR 73 | Temp 98.1°F | Resp 16 | Ht 66.93 in | Wt 150.6 lb

## 2017-02-05 DIAGNOSIS — Z23 Encounter for immunization: Secondary | ICD-10-CM | POA: Diagnosis not present

## 2017-02-05 DIAGNOSIS — R1030 Lower abdominal pain, unspecified: Secondary | ICD-10-CM | POA: Diagnosis not present

## 2017-02-05 DIAGNOSIS — Z1239 Encounter for other screening for malignant neoplasm of breast: Secondary | ICD-10-CM

## 2017-02-05 DIAGNOSIS — Z1231 Encounter for screening mammogram for malignant neoplasm of breast: Secondary | ICD-10-CM

## 2017-02-05 DIAGNOSIS — R6882 Decreased libido: Secondary | ICD-10-CM | POA: Diagnosis not present

## 2017-02-05 NOTE — Progress Notes (Signed)
Patient ID: Kelly Lowery, female   DOB: Aug 02, 1970, 46 y.o.   MRN: [id number]   Subjective:    Patient ID: Kelly Lowery, female    DOB: [birth year], 46 y.o.   MRN: [id number]  HPI  Patient here for a scheduled follow up.  States she is doing well.  Feels good.  Stays active.  No chest pain.  No sob.  No acid reflux.  No abdominal pain.  Bowels moving.  Saw gyn.  Planning for f/u in 3 months for f/u ultrasound.  Last seen 01/02/17.  No pain now.  She did have questions regarding decreased libido.  Appears to be multifactorial.  Her and her husband work different shifts.  Increased fatigue and stress.  Overall she feels she is handling things well.  Some vaginal dryness.  Discussed treatment options.  She wants to try otc lubricants.     Past Medical History:  Diagnosis Date  . Breast cyst    s/p aspiration  . Hypertension    Past Surgical History:  Procedure Laterality Date  . CESAREAN SECTION     two  . PARTIAL HYSTERECTOMY  12/30/2013  . TONSILLECTOMY  1974   Family History  Problem Relation Age of Onset  . Breast cancer Maternal Grandmother 47       47 and 60  . Lung cancer Paternal Aunt    Social History   Socioeconomic History  . Marital status: Married    Spouse name: None  . Number of children: None  . Years of education: None  . Highest education level: None  Social Needs  . Financial resource strain: None  . Food insecurity - worry: None  . Food insecurity - inability: None  . Transportation needs - medical: None  . Transportation needs - non-medical: None  Occupational History  . None  Tobacco Use  . Smoking status: Light Tobacco Smoker    Packs/day: 0.50    Years: 20.00    Pack years: 10.00    Types: Cigarettes  . Smokeless tobacco: Never Used  Substance and Sexual Activity  . Alcohol use: Yes    Comment: occasionally  . Drug use: No  . Sexual activity: None  Other Topics Concern  . None  Social History Narrative  . None    Outpatient Encounter  Medications as of 02/05/2017  Medication Sig  . albuterol (PROVENTIL HFA;VENTOLIN HFA) 108 (90 Base) MCG/ACT inhaler Inhale 2 puffs into the lungs every 6 (six) hours as needed for wheezing or shortness of breath.  Marland Kitchen aspirin EC 81 MG tablet Take 81 mg by mouth daily.  . Calcium Carbonate-Vit D-Min (CALCIUM 1200 PO) Take by mouth.  . cyanocobalamin 1000 MCG tablet Take 1,000 mcg by mouth daily.  . Multiple Vitamin (MULTIVITAMIN) capsule Take 1 capsule by mouth daily.  . [DISCONTINUED] amoxicillin (AMOXIL) 875 MG tablet Take 1 tablet (875 mg total) by mouth 2 (two) times daily.  . [DISCONTINUED] HYDROcodone-homatropine (HYCODAN) 5-1.5 MG/5ML syrup Take 5 mLs by mouth at bedtime as needed for cough.   No facility-administered encounter medications on file as of 02/05/2017.     Review of Systems  Constitutional: Negative for appetite change and unexpected weight change.  HENT: Negative for congestion and sinus pressure.   Respiratory: Negative for cough, chest tightness and shortness of breath.   Cardiovascular: Negative for chest pain, palpitations and leg swelling.  Gastrointestinal: Negative for abdominal pain, diarrhea, nausea and vomiting.  Genitourinary: Negative for difficulty urinating and dysuria.  Musculoskeletal: Negative for back  pain and joint swelling.  Skin: Negative for color change and rash.  Neurological: Negative for dizziness, light-headedness and headaches.  Psychiatric/Behavioral: Negative for agitation and dysphoric mood.       Objective:     Blood pressure rechecked by me:  128/84  Physical Exam  Constitutional: She appears well-developed and well-nourished. No distress.  HENT:  Nose: Nose normal.  Mouth/Throat: Oropharynx is clear and moist.  Neck: Neck supple. No thyromegaly present.  Cardiovascular: Normal rate and regular rhythm.  Pulmonary/Chest: Breath sounds normal. No respiratory distress. She has no wheezes.  Abdominal: Soft. Bowel sounds are normal.  There is no tenderness.  Musculoskeletal: She exhibits no edema or tenderness.  Lymphadenopathy:    She has no cervical adenopathy.  Skin: No rash noted. No erythema.  Psychiatric: She has a normal mood and affect. Her behavior is normal.    BP 108/78 (BP Location: Left Arm, Patient Position: Sitting, Cuff Size: Normal)   Pulse 73   Temp 98.1 F (36.7 C) (Oral)   Resp 16   Ht 5' 6.93" (1.7 m)   Wt 150 lb 9.6 oz (68.3 kg)   SpO2 97%   BMI 23.64 kg/m  Wt Readings from Last 3 Encounters:  02/05/17 150 lb 9.6 oz (68.3 kg)  11/27/16 151 lb 3.2 oz (68.6 kg)  09/11/16 154 lb 9.6 oz (70.1 kg)     Lab Results  Component Value Date   WBC 8.6 11/27/2016   HGB 13.8 11/27/2016   HCT 41.4 11/27/2016   PLT 338.0 11/27/2016   GLUCOSE 88 11/27/2016   CHOL 185 02/21/2016   TRIG 69.0 02/21/2016   HDL 58.20 02/21/2016   LDLCALC 113 (H) 02/21/2016   ALT 18 11/27/2016   AST 16 11/27/2016   NA 139 11/27/2016   K 4.0 11/27/2016   CL 105 11/27/2016   CREATININE 0.81 11/27/2016   BUN 12 11/27/2016   CO2 31 11/27/2016   TSH 1.37 02/21/2016    US Pelvis (transabdominal Only)  Result Date: 12/12/2016 CLINICAL DATA:  Patient with lower abdominal pain for 3 months. Prior hysterectomy. EXAM: TRANSABDOMINAL ULTRASOUND OF PELVIS TECHNIQUE: Transabdominal ultrasound examination of the pelvis was performed including evaluation of the uterus, ovaries, adnexal regions, and pelvic cul-de-sac. COMPARISON:  None. FINDINGS: Uterus Surgically absent Right ovary Measurements: 3.1 x 2.1 x 2.3 cm. Within the right ovary there is a 2.9 x 1.7 x 2.1 cm cyst with thin internal septation. Left ovary Surgically absent. Other findings:  No abnormal free fluid. IMPRESSION: Cyst with thin internal septation within the right ovary. Possible associated internal debris. Recommend follow-up pelvic ultrasound in 6- 8 weeks to ensure resolution. Surgically absent uterus and left ovary. Electronically Signed   By: Lovey Newcomer  M.D.   On: 12/12/2016 17:37       Assessment & Plan:   Problem List Items Addressed This Visit    Libido, decreased    Discussed with her today as outlined.  Probably multifactorial.  Some vaginal dryness.  Will use otc lubricants.  Handling stress.  Her husband is changing shifts at work soon and they will be on the same shift.        Lower abdominal pain    Saw gyn.  Planning for f/u - f/u pelvic ultrasound.  No pain now.         Other Visit Diagnoses    Screening for breast cancer    -  Primary   Relevant Orders   MM DIGITAL SCREENING BILATERAL  Need for immunization against influenza       Relevant Orders   Flu Vaccine QUAD 36+ mos IM (Completed)       Einar Pheasant, MD

## 2017-02-08 ENCOUNTER — Encounter: Payer: Self-pay | Admitting: Internal Medicine

## 2017-02-08 DIAGNOSIS — R6882 Decreased libido: Secondary | ICD-10-CM | POA: Insufficient documentation

## 2017-02-08 NOTE — Assessment & Plan Note (Signed)
Saw gyn.  Planning for f/u - f/u pelvic ultrasound.  No pain now.

## 2017-02-08 NOTE — Assessment & Plan Note (Signed)
Discussed with her today as outlined.  Probably multifactorial.  Some vaginal dryness.  Will use otc lubricants.  Handling stress.  Her husband is changing shifts at work soon and they will be on the same shift.

## 2017-03-06 ENCOUNTER — Ambulatory Visit
Admission: RE | Admit: 2017-03-06 | Discharge: 2017-03-06 | Disposition: A | Discharge status: Home / Self Care | Payer: 59 | Source: Ambulatory Visit | Attending: Internal Medicine | Admitting: Internal Medicine

## 2017-03-06 DIAGNOSIS — Z1239 Encounter for other screening for malignant neoplasm of breast: Secondary | ICD-10-CM

## 2017-03-06 DIAGNOSIS — Z1231 Encounter for screening mammogram for malignant neoplasm of breast: Secondary | ICD-10-CM | POA: Diagnosis not present

## 2017-06-05 ENCOUNTER — Ambulatory Visit: Payer: 59 | Admitting: Internal Medicine

## 2017-06-05 DIAGNOSIS — Z0289 Encounter for other administrative examinations: Secondary | ICD-10-CM

## 2017-06-18 ENCOUNTER — Ambulatory Visit: Payer: 59 | Admitting: Internal Medicine

## 2017-06-18 ENCOUNTER — Encounter: Payer: Self-pay | Admitting: Internal Medicine

## 2017-06-18 VITALS — BP 122/74 | HR 62 | Temp 97.7°F | Ht 66.93 in | Wt 149.8 lb

## 2017-06-18 DIAGNOSIS — H9201 Otalgia, right ear: Secondary | ICD-10-CM

## 2017-06-18 DIAGNOSIS — J0101 Acute recurrent maxillary sinusitis: Secondary | ICD-10-CM | POA: Diagnosis not present

## 2017-06-18 MED ORDER — PREDNISONE 20 MG PO TABS: 20.0000 mg | ORAL_TABLET | Freq: Every day | ORAL | 0 refills | Status: DC

## 2017-06-18 MED ORDER — AZELASTINE HCL 0.15 % NA SOLN: 2.0000 | Freq: Every day | NASAL | 1 refills | Status: DC

## 2017-06-18 MED ORDER — AZITHROMYCIN 250 MG PO TABS: ORAL_TABLET | ORAL | 0 refills | Status: DC

## 2017-06-18 NOTE — Progress Notes (Signed)
Pre visit review using our clinic review tool, if applicable. No additional management support is needed unless otherwise documented below in the visit note. 

## 2017-06-18 NOTE — Patient Instructions (Addendum)
Consider Ear Nose Throat if you do not get better  F/u in 1 week with Dr. Nicki Reaper if needed otherwise by 11/2017 Take care   Sinusitis, Adult Sinusitis is soreness and inflammation of your sinuses. Sinuses are hollow spaces in the bones around your face. Your sinuses are located:  Around your eyes.  In the middle of your forehead.  Behind your nose.  In your cheekbones.  Your sinuses and nasal passages are lined with a stringy fluid (mucus). Mucus normally drains out of your sinuses. When your nasal tissues become inflamed or swollen, the mucus can become trapped or blocked so air cannot flow through your sinuses. This allows bacteria, viruses, and funguses to grow, which leads to infection. Sinusitis can develop quickly and last for 7?10 days (acute) or for more than 12 weeks (chronic). Sinusitis often develops after a cold. What are the causes? This condition is caused by anything that creates swelling in the sinuses or stops mucus from draining, including:  Allergies.  Asthma.  Bacterial or viral infection.  Abnormally shaped bones between the nasal passages.  Nasal growths that contain mucus (nasal polyps).  Narrow sinus openings.  Pollutants, such as chemicals or irritants in the air.  A foreign object stuck in the nose.  A fungal infection. This is rare.  What increases the risk? The following factors may make you more likely to develop this condition:  Having allergies or asthma.  Having had a recent cold or respiratory tract infection.  Having structural deformities or blockages in your nose or sinuses.  Having a weak immune system.  Doing a lot of swimming or diving.  Overusing nasal sprays.  Smoking.  What are the signs or symptoms? The main symptoms of this condition are pain and a feeling of pressure around the affected sinuses. Other symptoms include:  Upper toothache.  Earache.  Headache.  Bad breath.  Decreased sense of smell and  taste.  A cough that may get worse at night.  Fatigue.  Fever.  Thick drainage from your nose. The drainage is often green and it may contain pus (purulent).  Stuffy nose or congestion.  Postnasal drip. This is when extra mucus collects in the throat or back of the nose.  Swelling and warmth over the affected sinuses.  Sore throat.  Sensitivity to light.  How is this diagnosed? This condition is diagnosed based on symptoms, a medical history, and a physical exam. To find out if your condition is acute or chronic, your health care provider may:  Look in your nose for signs of nasal polyps.  Tap over the affected sinus to check for signs of infection.  View the inside of your sinuses using an imaging device that has a light attached (endoscope).  If your health care provider suspects that you have chronic sinusitis, you may also:  Be tested for allergies.  Have a sample of mucus taken from your nose (nasal culture) and checked for bacteria.  Have a mucus sample examined to see if your sinusitis is related to an allergy.  If your sinusitis does not respond to treatment and it lasts longer than 8 weeks, you may have an MRI or CT scan to check your sinuses. These scans also help to determine how severe your infection is. In rare cases, a bone biopsy may be done to rule out more serious types of fungal sinus disease. How is this treated? Treatment for sinusitis depends on the cause and whether your condition is chronic or acute.  If a virus is causing your sinusitis, your symptoms will go away on their own within 10 days. You may be given medicines to relieve your symptoms, including:  Topical nasal decongestants. They shrink swollen nasal passages and let mucus drain from your sinuses.  Antihistamines. These drugs block inflammation that is triggered by allergies. This can help to ease swelling in your nose and sinuses.  Topical nasal corticosteroids. These are nasal sprays  that ease inflammation and swelling in your nose and sinuses.  Nasal saline washes. These rinses can help to get rid of thick mucus in your nose.  If your condition is caused by bacteria, you will be given an antibiotic medicine. If your condition is caused by a fungus, you will be given an antifungal medicine. Surgery may be needed to correct underlying conditions, such as narrow nasal passages. Surgery may also be needed to remove polyps. Follow these instructions at home: Medicines  Take, use, or apply over-the-counter and prescription medicines only as told by your health care provider. These may include nasal sprays.  If you were prescribed an antibiotic medicine, take it as told by your health care provider. Do not stop taking the antibiotic even if you start to feel better. Hydrate and Humidify  Drink enough water to keep your urine clear or pale yellow. Staying hydrated will help to thin your mucus.  Use a cool mist humidifier to keep the humidity level in your home above 50%.  Inhale steam for 10-15 minutes, 3-4 times a day or as told by your health care provider. You can do this in the bathroom while a hot shower is running.  Limit your exposure to cool or dry air. Rest  Rest as much as possible.  Sleep with your head raised (elevated).  Make sure to get enough sleep each night. General instructions  Apply a warm, moist washcloth to your face 3-4 times a day or as told by your health care provider. This will help with discomfort.  Wash your hands often with soap and water to reduce your exposure to viruses and other germs. If soap and water are not available, use hand sanitizer.  Do not smoke. Avoid being around people who are smoking (secondhand smoke).  Keep all follow-up visits as told by your health care provider. This is important. Contact a health care provider if:  You have a fever.  Your symptoms get worse.  Your symptoms do not improve within 10  days. Get help right away if:  You have a severe headache.  You have persistent vomiting.  You have pain or swelling around your face or eyes.  You have vision problems.  You develop confusion.  Your neck is stiff.  You have trouble breathing. This information is not intended to replace advice given to you by your health care provider. Make sure you discuss any questions you have with your health care provider. Document Released: 03/24/2005 Document Revised: 11/18/2015 Document Reviewed: 01/17/2015 Elsevier Interactive Patient Education  [employer].

## 2017-06-18 NOTE — Progress Notes (Signed)
Chief Complaint  Patient presents with  . Follow-up   Follow up  1.5 weeks ago was sick went to [facility] Rx Amoxicillin due to sinus issues took x 10 days and felt better but now c/o right ear pain and left ear pressure. Right ear feels clogged. Right side of face affected with pain mostly. She started using Claritin again and Flonase and now nose is runny. Denies allergies. She does report 1x per year she feels like this. No fever/chills. She does c/o cough with phelgm. She also c/o mild neck pain     Review of Systems  Constitutional: Negative for chills and fever.  HENT: Positive for ear pain and sinus pain.   Eyes: Negative for blurred vision.  Respiratory: Positive for cough and sputum production.   Cardiovascular: Negative for chest pain.  Musculoskeletal: Positive for neck pain.  Skin: Negative for rash.  Neurological: Negative for headaches.   Past Medical History:  Diagnosis Date  . Breast cyst    s/p aspiration  . Hypertension    Past Surgical History:  Procedure Laterality Date  . BREAST CYST ASPIRATION    . CESAREAN SECTION     two  . PARTIAL HYSTERECTOMY  12/30/2013  . TONSILLECTOMY  1974   Family History  Problem Relation Age of Onset  . Breast cancer Maternal Grandmother 47       47 and 60  . Lung cancer Paternal Aunt    Social History   Socioeconomic History  . Marital status: Married    Spouse name: Not on file  . Number of children: Not on file  . Years of education: Not on file  . Highest education level: Not on file  Social Needs  . Financial resource strain: Not on file  . Food insecurity - worry: Not on file  . Food insecurity - inability: Not on file  . Transportation needs - medical: Not on file  . Transportation needs - non-medical: Not on file  Occupational History  . Not on file  Tobacco Use  . Smoking status: Light Tobacco Smoker    Packs/day: 0.50    Years: 20.00    Pack years: 10.00    Types: Cigarettes  . Smokeless  tobacco: Never Used  Substance and Sexual Activity  . Alcohol use: Yes    Comment: occasionally  . Drug use: No  . Sexual activity: Not on file  Other Topics Concern  . Not on file  Social History Narrative   3 dogs labradors   Current Meds  Medication Sig  . albuterol (PROVENTIL HFA;VENTOLIN HFA) 108 (90 Base) MCG/ACT inhaler Inhale 2 puffs into the lungs every 6 (six) hours as needed for wheezing or shortness of breath.  Marland Kitchen aspirin EC 81 MG tablet Take 81 mg by mouth daily.  . Calcium Carbonate-Vit D-Min (CALCIUM 1200 PO) Take by mouth.  . cyanocobalamin 1000 MCG tablet Take 1,000 mcg by mouth daily.  . Multiple Vitamin (MULTIVITAMIN) capsule Take 1 capsule by mouth daily.   Allergies  Allergen Reactions  . Doxycycline     Hives   . Levaquin [Levofloxacin] Other (See Comments)    Joint pain  . Keflex [Cephalexin] Rash   No results found for this or any previous visit (from the past 2160 hour(s)). Objective  Body mass index is 23.51 kg/m. Wt Readings from Last 3 Encounters:  06/18/17 149 lb 12.8 oz (67.9 kg)  02/05/17 150 lb 9.6 oz (68.3 kg)  11/27/16 151 lb 3.2 oz (68.6  kg)   Temp Readings from Last 3 Encounters:  06/18/17 97.7 F (36.5 C) (Oral)  02/05/17 98.1 F (36.7 C) (Oral)  11/27/16 98 F (36.7 C) (Oral)   BP Readings from Last 3 Encounters:  06/18/17 122/74  02/05/17 108/78  11/27/16 114/70   Pulse Readings from Last 3 Encounters:  06/18/17 62  02/05/17 73  11/27/16 83   O2 sat room air 98% Physical Exam  Constitutional: She is oriented to person, place, and time and well-developed, well-nourished, and in no distress. Vital signs are normal.  HENT:  Head: Normocephalic and atraumatic.  Nose: Right sinus exhibits maxillary sinus tenderness. Right sinus exhibits no frontal sinus tenderness. Left sinus exhibits no maxillary sinus tenderness and no frontal sinus tenderness.  Mouth/Throat: Oropharynx is clear and moist and mucous membranes are normal.   Fluid in right ear left ear clear   Eyes: Conjunctivae are normal. Pupils are equal, round, and reactive to light.  Cardiovascular: Normal rate, regular rhythm and normal heart sounds.  Pulmonary/Chest: Effort normal and breath sounds normal.  Neurological: She is alert and oriented to person, place, and time. Gait normal. Gait normal.  Skin: Skin is warm, dry and intact.  Psychiatric: Mood, memory, affect and judgment normal.  Nursing note and vitals reviewed.   Assessment   1. Sinusitis right maxillary and right ear pain likely 2/2 fluid  Plan  1. Zpack, prednisone, cont flonase, claritin, rec mucinex dm green label cough, OTC phenyeephrine pt cant tolerate sudafed products Trial of astepro with flonase  If sx's persist call back in 1 week or sooner also disc with pt consider ENT/allergy testing.   Provider: Dr. Olivia Mackie McLean-Scocuz[facility]

## 2018-04-13 ENCOUNTER — Ambulatory Visit
Admission: RE | Admit: 2018-04-13 | Discharge: 2018-04-13 | Disposition: A | Discharge status: Home / Self Care | Payer: 59 | Source: Ambulatory Visit | Attending: Obstetrics & Gynecology | Admitting: Obstetrics & Gynecology

## 2018-04-13 ENCOUNTER — Other Ambulatory Visit: Payer: Self-pay | Admitting: Obstetrics & Gynecology

## 2018-04-13 DIAGNOSIS — Z1231 Encounter for screening mammogram for malignant neoplasm of breast: Secondary | ICD-10-CM | POA: Diagnosis present

## 2018-04-16 ENCOUNTER — Ambulatory Visit (INDEPENDENT_AMBULATORY_CARE_PROVIDER_SITE_OTHER): Payer: 59 | Admitting: Internal Medicine

## 2018-04-16 ENCOUNTER — Encounter

## 2018-04-16 VITALS — BP 116/74 | HR 93 | Temp 97.9°F | Resp 16 | Wt 155.4 lb

## 2018-04-16 DIAGNOSIS — R928 Other abnormal and inconclusive findings on diagnostic imaging of breast: Secondary | ICD-10-CM

## 2018-04-16 DIAGNOSIS — R197 Diarrhea, unspecified: Secondary | ICD-10-CM

## 2018-04-16 DIAGNOSIS — J3489 Other specified disorders of nose and nasal sinuses: Secondary | ICD-10-CM

## 2018-04-16 DIAGNOSIS — E559 Vitamin D deficiency, unspecified: Secondary | ICD-10-CM | POA: Diagnosis not present

## 2018-04-16 DIAGNOSIS — Z13220 Encounter for screening for lipoid disorders: Secondary | ICD-10-CM

## 2018-04-16 MED ORDER — VARENICLINE TARTRATE 1 MG PO TABS: 1.0000 mg | ORAL_TABLET | Freq: Two times a day (BID) | ORAL | 1 refills | Status: DC

## 2018-04-16 MED ORDER — VARENICLINE TARTRATE 0.5 MG X 11 & 1 MG X 42 PO MISC: ORAL | 0 refills | Status: DC

## 2018-04-16 MED ORDER — MUPIROCIN 2 % EX OINT: 1.0000 "application " | TOPICAL_OINTMENT | Freq: Two times a day (BID) | CUTANEOUS | 0 refills | Status: DC

## 2018-04-16 MED ORDER — BUPROPION HCL ER (XL) 150 MG PO TB24: 150.0000 mg | ORAL_TABLET | Freq: Every day | ORAL | 2 refills | Status: DC

## 2018-04-16 NOTE — Progress Notes (Signed)
Patient ID: Kelly Lowery, female   DOB: June 27, 1970, 48 y.o.   MRN: [id number]   Subjective:    Patient ID: Kelly Lowery, female    DOB: 1971-03-25, 49 y.o.   MRN: [id number]  HPI  Patient here for her physical exam.  Saw Dr Leonides Schanz 04/13/18 and had breast and pelvic exam.  So, we'll change visit to a follow up.  She reports she is doing relatively well.  Seeing gyn for previous pelvic pain.  This has resolved.  Had cyst - ovary.  Felt no further w/up warranted per pt and note.  Reports 2-3 weeks ago, increased chills, cough and congestion.  Using astelin nasal spray.  Better now.  Does still report soreness left nostril.  No chest congestion.  No increased cough now.  No chest pain or sob.  No acid reflux.  Does reports persistent diarrhea.  States may have 6 stools in am.  Varies.  Has well water.  Husband has had some loose stool intermittently as well.  No abdominal pain.  No blood.  Has not had colonoscopy.  Handling stress.  Just had mammogram.  Ordered by gyn.  Discussed results.  Needs f/u views.  Wants me to go ahead and schedule.      Past Medical History:  Diagnosis Date  . Breast cyst    s/p aspiration  . Hypertension    Past Surgical History:  Procedure Laterality Date  . BREAST CYST ASPIRATION    . CESAREAN SECTION     two  . PARTIAL HYSTERECTOMY  12/30/2013  . TONSILLECTOMY  1974   Family History  Problem Relation Age of Onset  . Breast cancer Maternal Grandmother 47       47 and 60  . Lung cancer Paternal Aunt    Social History   Socioeconomic History  . Marital status: Married    Spouse name: Not on file  . Number of children: Not on file  . Years of education: Not on file  . Highest education level: Not on file  Occupational History  . Not on file  Social Needs  . Financial resource strain: Not on file  . Food insecurity:    Worry: Not on file    Inability: Not on file  . Transportation needs:    Medical: Not on file    Non-medical: Not on file  Tobacco Use   . Smoking status: Light Tobacco Smoker    Packs/day: 0.50    Years: 20.00    Pack years: 10.00    Types: Cigarettes  . Smokeless tobacco: Never Used  Substance and Sexual Activity  . Alcohol use: Yes    Comment: occasionally  . Drug use: No  . Sexual activity: Not on file  Lifestyle  . Physical activity:    Days per week: Not on file    Minutes per session: Not on file  . Stress: Not on file  Relationships  . Social connections:    Talks on phone: Not on file    Gets together: Not on file    Attends religious service: Not on file    Active member of club or organization: Not on file    Attends meetings of clubs or organizations: Not on file    Relationship status: Not on file  Other Topics Concern  . Not on file  Social History Narrative   3 dogs labradors    Outpatient Encounter Medications as of 04/16/2018  Medication Sig  . albuterol (PROVENTIL HFA;VENTOLIN HFA) 108 (  90 Base) MCG/ACT inhaler Inhale 2 puffs into the lungs every 6 (six) hours as needed for wheezing or shortness of breath.  Marland Kitchen aspirin EC 81 MG tablet Take 81 mg by mouth daily.  . Azelastine HCl (ASTEPRO) 0.15 % SOLN Place 2 sprays into the nose daily. 1x per day  . Calcium Carbonate-Vit D-Min (CALCIUM 1200 PO) Take by mouth.  . cyanocobalamin 1000 MCG tablet Take 1,000 mcg by mouth daily.  . Multiple Vitamin (MULTIVITAMIN) capsule Take 1 capsule by mouth daily.  . [DISCONTINUED] azithromycin (ZITHROMAX) 250 MG tablet 2 pills day 1 and 1 pill day 2-5  . [DISCONTINUED] predniSONE (DELTASONE) 20 MG tablet Take 1 tablet (20 mg total) by mouth daily with breakfast.  . buPROPion (WELLBUTRIN XL) 150 MG 24 hr tablet Take 1 tablet (150 mg total) by mouth daily.  . mupirocin ointment (BACTROBAN) 2 % Place 1 application into the nose 2 (two) times daily.  . varenicline (CHANTIX CONTINUING MONTH PAK) 1 MG tablet Take 1 tablet (1 mg total) by mouth 2 (two) times daily.  . varenicline (CHANTIX STARTING MONTH PAK) 0.5 MG  X 11 & 1 MG X 42 tablet Take one 0.5 mg tablet by mouth once daily for 3 days, then increase to one 0.5 mg tablet twice daily for 4 days, then increase to one 1 mg tablet twice daily.   No facility-administered encounter medications on file as of 04/16/2018.     Review of Systems  Constitutional: Negative for appetite change and unexpected weight change.  HENT: Negative for congestion, postnasal drip and sinus pressure.        Nasal soreness as outlined.    Respiratory: Negative for cough, chest tightness and shortness of breath.   Cardiovascular: Negative for chest pain, palpitations and leg swelling.  Gastrointestinal: Negative for abdominal pain, diarrhea, nausea and vomiting.  Genitourinary: Negative for difficulty urinating and dysuria.  Musculoskeletal: Negative for joint swelling and myalgias.  Skin: Negative for color change and rash.  Neurological: Negative for dizziness, light-headedness and headaches.  Psychiatric/Behavioral: Negative for agitation and dysphoric mood.       Objective:    Physical Exam Constitutional:      General: She is not in acute distress.    Appearance: Normal appearance. She is well-developed.  HENT:     Nose: Nose normal. No congestion.  Neck:     Musculoskeletal: Neck supple. No muscular tenderness.     Thyroid: No thyromegaly.  Cardiovascular:     Rate and Rhythm: Normal rate and regular rhythm.  Pulmonary:     Effort: No tachypnea, accessory muscle usage or respiratory distress.     Breath sounds: Normal breath sounds. No decreased breath sounds or wheezing.  Chest:     Breasts:        Right: No inverted nipple, mass, nipple discharge or tenderness (no axillary adenopathy).        Left: No inverted nipple, mass, nipple discharge or tenderness (no axilarry adenopathy).  Abdominal:     General: Bowel sounds are normal.     Palpations: Abdomen is soft.     Tenderness: There is no abdominal tenderness.  Musculoskeletal:        General: No  swelling or tenderness.  Lymphadenopathy:     Cervical: No cervical adenopathy.  Skin:    Findings: No erythema or rash.  Neurological:     Mental Status: She is alert and oriented to person, place, and time.  Psychiatric:  Mood and Affect: Mood normal.        Behavior: Behavior normal.     BP 116/74 (BP Location: Left Arm, Patient Position: Sitting, Cuff Size: Normal)   Pulse 93   Temp 97.9 F (36.6 C) (Oral)   Resp 16   Wt 155 lb 6.4 oz (70.5 kg)   SpO2 96%   BMI 24.39 kg/m  Wt Readings from Last 3 Encounters:  04/16/18 155 lb 6.4 oz (70.5 kg)  06/18/17 149 lb 12.8 oz (67.9 kg)  02/05/17 150 lb 9.6 oz (68.3 kg)     Lab Results  Component Value Date   WBC 8.6 11/27/2016   HGB 13.8 11/27/2016   HCT 41.4 11/27/2016   PLT 338.0 11/27/2016   GLUCOSE 88 11/27/2016   CHOL 185 02/21/2016   TRIG 69.0 02/21/2016   HDL 58.20 02/21/2016   LDLCALC 113 (H) 02/21/2016   ALT 18 11/27/2016   AST 16 11/27/2016   NA 139 11/27/2016   K 4.0 11/27/2016   CL 105 11/27/2016   CREATININE 0.81 11/27/2016   BUN 12 11/27/2016   CO2 31 11/27/2016   TSH 1.37 02/21/2016    Mm 3d Screen Breast Bilateral  Result Date: 04/13/2018 CLINICAL DATA:  Screening. EXAM: DIGITAL SCREENING BILATERAL MAMMOGRAM WITH TOMO AND CAD COMPARISON:  Previous exam(s). ACR Breast Density Category c: The breast tissue is heterogeneously dense, which may obscure small masses. FINDINGS: In the left breast, a possible asymmetry with associated microcalcifications warrants further evaluation. In the right breast, no findings suspicious for malignancy. Images were processed with CAD. IMPRESSION: Further evaluation is suggested for possible asymmetry/calcifications in the left breast. RECOMMENDATION: Diagnostic mammogram and possibly ultrasound of the left breast. (Code:FI-L-10M) The patient will be contacted regarding the findings, and additional imaging will be scheduled. BI-RADS CATEGORY  0: Incomplete. Need  additional imaging evaluation and/or prior mammograms for comparison. Electronically Signed   By: Fidela Salisbury M.D.   On: 04/13/2018 13:56       Assessment & Plan:   Problem List Items Addressed This Visit    Abnormal mammogram    Abnormal mammogram as outlined.  Recommended f/u views of left breast.  Ordered.        Relevant Orders   MM DIAG BREAST TOMO UNI LEFT   US BREAST LTD UNI LEFT INC AXILLA   Diarrhea - Primary    Persistent loose stool.  Obtain stool studies.  Discussed probiotics.  Also, due colonoscopy soon - will be 48.  Refer to GI.        Relevant Orders   Gastrointestinal Pathogen Panel PCR   CBC with Differential/Platelet   Hepatic function panel   TSH   Basic metabolic panel   Nasal sore    Soreness left nostril.  Question of minimal erythema - intranasal.  bactroban ointment topical.  Notify me if persistent.        Vitamin D deficiency    Recheck vitamin D level.        Relevant Orders   VITAMIN D 25 Hydroxy (Vit-D Deficiency, Fractures)    Other Visit Diagnoses    Screening cholesterol level       Relevant Orders   Lipid panel       Einar Pheasant, MD

## 2018-04-19 ENCOUNTER — Other Ambulatory Visit: Payer: Self-pay | Admitting: Obstetrics & Gynecology

## 2018-04-19 ENCOUNTER — Encounter: Payer: Self-pay | Admitting: Internal Medicine

## 2018-04-19 DIAGNOSIS — N6489 Other specified disorders of breast: Secondary | ICD-10-CM

## 2018-04-19 DIAGNOSIS — R921 Mammographic calcification found on diagnostic imaging of breast: Secondary | ICD-10-CM

## 2018-04-19 DIAGNOSIS — R928 Other abnormal and inconclusive findings on diagnostic imaging of breast: Secondary | ICD-10-CM

## 2018-04-19 DIAGNOSIS — J3489 Other specified disorders of nose and nasal sinuses: Secondary | ICD-10-CM | POA: Insufficient documentation

## 2018-04-19 DIAGNOSIS — E559 Vitamin D deficiency, unspecified: Secondary | ICD-10-CM | POA: Insufficient documentation

## 2018-04-19 DIAGNOSIS — R197 Diarrhea, unspecified: Secondary | ICD-10-CM | POA: Insufficient documentation

## 2018-04-19 NOTE — Assessment & Plan Note (Signed)
Abnormal mammogram as outlined.  Recommended f/u views of left breast.  Ordered.

## 2018-04-19 NOTE — Assessment & Plan Note (Signed)
Recheck vitamin D level 

## 2018-04-19 NOTE — Assessment & Plan Note (Signed)
Soreness left nostril.  Question of minimal erythema - intranasal.  bactroban ointment topical.  Notify me if persistent.

## 2018-04-19 NOTE — Assessment & Plan Note (Signed)
Persistent loose stool.  Obtain stool studies.  Discussed probiotics.  Also, due colonoscopy soon - will be 48.  Refer to GI.

## 2018-04-20 ENCOUNTER — Other Ambulatory Visit (INDEPENDENT_AMBULATORY_CARE_PROVIDER_SITE_OTHER): Payer: 59

## 2018-04-20 ENCOUNTER — Ambulatory Visit
Admission: RE | Admit: 2018-04-20 | Discharge: 2018-04-20 | Disposition: A | Discharge status: Home / Self Care | Payer: 59 | Source: Ambulatory Visit | Attending: Obstetrics & Gynecology | Admitting: Obstetrics & Gynecology

## 2018-04-20 DIAGNOSIS — Z13220 Encounter for screening for lipoid disorders: Secondary | ICD-10-CM

## 2018-04-20 DIAGNOSIS — N6489 Other specified disorders of breast: Secondary | ICD-10-CM | POA: Insufficient documentation

## 2018-04-20 DIAGNOSIS — E559 Vitamin D deficiency, unspecified: Secondary | ICD-10-CM

## 2018-04-20 DIAGNOSIS — R928 Other abnormal and inconclusive findings on diagnostic imaging of breast: Secondary | ICD-10-CM | POA: Diagnosis present

## 2018-04-20 DIAGNOSIS — R197 Diarrhea, unspecified: Secondary | ICD-10-CM | POA: Diagnosis not present

## 2018-04-20 DIAGNOSIS — R921 Mammographic calcification found on diagnostic imaging of breast: Secondary | ICD-10-CM | POA: Diagnosis present

## 2018-04-20 LAB — BASIC METABOLIC PANEL
BUN: 13 mg/dL (ref 6–23)
CO2: 27 mEq/L (ref 19–32)
Calcium: 9.7 mg/dL (ref 8.4–10.5)
Chloride: 104 mEq/L (ref 96–112)
Creatinine, Ser: 0.81 mg/dL (ref 0.40–1.20)
GFR: 79.65 mL/min (ref >60.00)
Glucose, Bld: 107 mg/dL — ABNORMAL HIGH (ref 70–99)
Potassium: 4 mEq/L (ref 3.5–5.1)
Sodium: 139 mEq/L (ref 135–145)

## 2018-04-20 LAB — CBC WITH DIFFERENTIAL/PLATELET
Basophils Absolute: 0.1 10*3/uL (ref 0.0–0.1)
Basophils Relative: 0.8 % (ref 0.0–3.0)
Eosinophils Absolute: 0.3 10*3/uL (ref 0.0–0.7)
Eosinophils Relative: 3.6 % (ref 0.0–5.0)
HCT: 39.9 % (ref 36.0–46.0)
Hemoglobin: 13.5 g/dL (ref 12.0–15.0)
Lymphocytes Relative: 34.9 % (ref 12.0–46.0)
Lymphs Abs: 2.4 10*3/uL (ref 0.7–4.0)
MCHC: 33.9 g/dL (ref 30.0–36.0)
MCV: 90.4 fl (ref 78.0–100.0)
Monocytes Absolute: 0.5 10*3/uL (ref 0.1–1.0)
Monocytes Relative: 7.8 % (ref 3.0–12.0)
Neutro Abs: 3.7 10*3/uL (ref 1.4–7.7)
Neutrophils Relative %: 52.9 % (ref 43.0–77.0)
Platelets: 318 10*3/uL (ref 150.0–400.0)
RBC: 4.41 Mil/uL (ref 3.87–5.11)
RDW: 13.2 % (ref 11.5–15.5)
WBC: 7 10*3/uL (ref 4.0–10.5)

## 2018-04-20 LAB — LIPID PANEL
Cholesterol: 199 mg/dL (ref 0–200)
HDL: 51.9 mg/dL (ref >39.00)
LDL Cholesterol: 115 mg/dL — ABNORMAL HIGH (ref 0–99)
NonHDL: 146.79
Total CHOL/HDL Ratio: 4
Triglycerides: 161 mg/dL — ABNORMAL HIGH (ref 0.0–149.0)
VLDL: 32.2 mg/dL (ref 0.0–40.0)

## 2018-04-20 LAB — HEPATIC FUNCTION PANEL
ALT: 31 U/L (ref 0–35)
AST: 26 U/L (ref 0–37)
Albumin: 4.2 g/dL (ref 3.5–5.2)
Alkaline Phosphatase: 85 U/L (ref 39–117)
Bilirubin, Direct: 0.1 mg/dL (ref 0.0–0.3)
Total Bilirubin: 0.3 mg/dL (ref 0.2–1.2)
Total Protein: 7.1 g/dL (ref 6.0–8.3)

## 2018-04-20 LAB — TSH: TSH: 2.52 u[IU]/mL (ref 0.35–4.50)

## 2018-04-20 LAB — VITAMIN D 25 HYDROXY (VIT D DEFICIENCY, FRACTURES): VITD: 27.63 ng/mL — ABNORMAL LOW (ref 30.00–100.00)

## 2018-04-21 ENCOUNTER — Other Ambulatory Visit: Payer: Self-pay | Admitting: Internal Medicine

## 2018-04-21 DIAGNOSIS — R739 Hyperglycemia, unspecified: Secondary | ICD-10-CM

## 2018-04-21 NOTE — Progress Notes (Signed)
Order placed for f/u labs.  

## 2018-04-22 LAB — GASTROINTESTINAL PATHOGEN PANEL PCR
C. difficile Tox A/B, PCR: NOT DETECTED
Campylobacter, PCR: NOT DETECTED
Cryptosporidium, PCR: NOT DETECTED
E coli (ETEC) LT/ST PCR: NOT DETECTED
E coli (STEC) stx1/stx2, PCR: NOT DETECTED
E coli 0157, PCR: NOT DETECTED
Giardia lamblia, PCR: NOT DETECTED
Norovirus, PCR: NOT DETECTED
Rotavirus A, PCR: NOT DETECTED
Salmonella, PCR: NOT DETECTED
Shigella, PCR: NOT DETECTED

## 2018-05-12 ENCOUNTER — Other Ambulatory Visit: Payer: Self-pay | Admitting: Internal Medicine

## 2018-05-13 NOTE — Telephone Encounter (Signed)
She has rx for starter pack and then maintenance dose x 2 months.

## 2018-05-14 NOTE — Telephone Encounter (Signed)
rx given to patient while she was here

## 2018-05-18 ENCOUNTER — Other Ambulatory Visit: Payer: 59

## 2018-05-25 ENCOUNTER — Other Ambulatory Visit (INDEPENDENT_AMBULATORY_CARE_PROVIDER_SITE_OTHER): Payer: 59

## 2018-05-25 DIAGNOSIS — R739 Hyperglycemia, unspecified: Secondary | ICD-10-CM

## 2018-05-25 LAB — HEMOGLOBIN A1C: Hgb A1c MFr Bld: 5.6 % (ref 4.6–6.5)

## 2018-05-26 LAB — GLUCOSE, FASTING: Glucose, Plasma: 105 mg/dL — ABNORMAL HIGH (ref 65–99)

## 2018-07-03 ENCOUNTER — Other Ambulatory Visit: Payer: Self-pay | Admitting: Internal Medicine

## 2018-07-05 ENCOUNTER — Other Ambulatory Visit: Payer: Self-pay

## 2018-07-05 ENCOUNTER — Ambulatory Visit: Payer: 59 | Admitting: Internal Medicine

## 2018-07-05 ENCOUNTER — Encounter: Payer: Self-pay | Admitting: Internal Medicine

## 2018-07-05 VITALS — BP 110/80 | HR 73 | Temp 97.8°F | Wt 160.4 lb

## 2018-07-05 DIAGNOSIS — L918 Other hypertrophic disorders of the skin: Secondary | ICD-10-CM | POA: Diagnosis not present

## 2018-07-05 DIAGNOSIS — Z13220 Encounter for screening for lipoid disorders: Secondary | ICD-10-CM

## 2018-07-05 DIAGNOSIS — E559 Vitamin D deficiency, unspecified: Secondary | ICD-10-CM | POA: Diagnosis not present

## 2018-07-05 DIAGNOSIS — G479 Sleep disorder, unspecified: Secondary | ICD-10-CM

## 2018-07-05 DIAGNOSIS — Z720 Tobacco use: Secondary | ICD-10-CM

## 2018-07-05 DIAGNOSIS — M778 Other enthesopathies, not elsewhere classified: Secondary | ICD-10-CM

## 2018-07-05 DIAGNOSIS — R739 Hyperglycemia, unspecified: Secondary | ICD-10-CM | POA: Diagnosis not present

## 2018-07-05 DIAGNOSIS — Z1211 Encounter for screening for malignant neoplasm of colon: Secondary | ICD-10-CM

## 2018-07-05 MED ORDER — BUPROPION HCL ER (XL) 150 MG PO TB24: 150.0000 mg | ORAL_TABLET | Freq: Every day | ORAL | 2 refills | Status: DC

## 2018-07-05 MED ORDER — TRAZODONE HCL 50 MG PO TABS: 25.0000 mg | ORAL_TABLET | Freq: Every evening | ORAL | 1 refills | Status: DC | PRN

## 2018-07-05 NOTE — Progress Notes (Signed)
Patient ID: Kelly Lowery, female   DOB: [birth year], 48 y.o.   MRN: [id number]   Subjective:    Patient ID: Kelly Lowery, female    DOB: 12-30-1970, 48 y.o.   MRN: [id number]  HPI  Patient here for a scheduled follow up.  She reports she is doing relatively well. Taking wellbutrin.  On chantix.  Has stopped smoking.  Stopped 5 weeks ago.  Eating.  Has gained some weight.  Discussed diet and exercise.  No chest pain. No sob.  No acid reflux.  No abdominal pain.  Bowels moving.  Having pain in her right elbow.  Hurts with lifting objects and certain movements.  Taking melatonin to help her sleep.  Still having issues.  Has trouble staying asleep.  Has been an issue for a long time.  Feels needs something to help her sleep.  Has skin tag.  Request dermatology referral.      Past Medical History:  Diagnosis Date   Breast cyst    s/p aspiration   Hypertension    Past Surgical History:  Procedure Laterality Date   BREAST CYST ASPIRATION     CESAREAN SECTION     two   PARTIAL HYSTERECTOMY  12/30/2013   TONSILLECTOMY  1974   Family History  Problem Relation Age of Onset   Breast cancer Maternal Grandmother 18       47 and 73   Lung cancer Paternal Aunt    Social History   Socioeconomic History   Marital status: Married    Spouse name: Not on file   Number of children: Not on file   Years of education: Not on file   Highest education level: Not on file  Occupational History   Not on file  Social Needs   Financial resource strain: Not on file   Food insecurity:    Worry: Not on file    Inability: Not on file   Transportation needs:    Medical: Not on file    Non-medical: Not on file  Tobacco Use   Smoking status: Light Tobacco Smoker    Packs/day: 0.50    Years: 20.00    Pack years: 10.00    Types: Cigarettes   Smokeless tobacco: Never Used  Substance and Sexual Activity   Alcohol use: Yes    Comment: occasionally   Drug use: No   Sexual activity:  Not on file  Lifestyle   Physical activity:    Days per week: Not on file    Minutes per session: Not on file   Stress: Not on file  Relationships   Social connections:    Talks on phone: Not on file    Gets together: Not on file    Attends religious service: Not on file    Active member of club or organization: Not on file    Attends meetings of clubs or organizations: Not on file    Relationship status: Not on file  Other Topics Concern   Not on file  Social History Narrative   3 dogs labradors    Outpatient Encounter Medications as of 07/05/2018  Medication Sig   albuterol (PROVENTIL HFA;VENTOLIN HFA) 108 (90 Base) MCG/ACT inhaler Inhale 2 puffs into the lungs every 6 (six) hours as needed for wheezing or shortness of breath.   aspirin EC 81 MG tablet Take 81 mg by mouth daily.   Azelastine HCl (ASTEPRO) 0.15 % SOLN Place 2 sprays into the nose daily. 1x per day   buPROPion [facility]  XL) 150 MG 24 hr tablet Take 1 tablet (150 mg total) by mouth daily.   Calcium Carbonate-Vit D-Min (CALCIUM 1200 PO) Take by mouth.   CHANTIX 1 MG tablet TAKE 1 TABLET BY MOUTH TWICE A DAY   cyanocobalamin 1000 MCG tablet Take 1,000 mcg by mouth daily.   Multiple Vitamin (MULTIVITAMIN) capsule Take 1 capsule by mouth daily.   mupirocin ointment (BACTROBAN) 2 % Place 1 application into the nose 2 (two) times daily.   varenicline (CHANTIX STARTING MONTH PAK) 0.5 MG X 11 & 1 MG X 42 tablet Take one 0.5 mg tablet by mouth once daily for 3 days, then increase to one 0.5 mg tablet twice daily for 4 days, then increase to one 1 mg tablet twice daily.   [DISCONTINUED] buPROPion (WELLBUTRIN XL) 150 MG 24 hr tablet Take 1 tablet (150 mg total) by mouth daily.   traZODone (DESYREL) 50 MG tablet Take 0.5-1 tablets (25-50 mg total) by mouth at bedtime as needed for sleep.   No facility-administered encounter medications on file as of 07/05/2018.     Review of Systems  Constitutional: Negative  for appetite change and unexpected weight change.  HENT: Negative for congestion and sinus pressure.   Respiratory: Negative for cough, chest tightness and shortness of breath.   Cardiovascular: Negative for chest pain, palpitations and leg swelling.  Gastrointestinal: Negative for abdominal pain, diarrhea, nausea and vomiting.  Genitourinary: Negative for difficulty urinating and dysuria.  Musculoskeletal: Negative for myalgias.       Elbow pain as outlined.    Skin: Negative for color change and rash.  Neurological: Negative for dizziness, light-headedness and headaches.  Psychiatric/Behavioral: Negative for agitation and dysphoric mood.       Objective:    Physical Exam Constitutional:      General: She is not in acute distress.    Appearance: Normal appearance.  HENT:     Nose: Nose normal. No congestion.     Mouth/Throat:     Pharynx: No oropharyngeal exudate or posterior oropharyngeal erythema.  Neck:     Musculoskeletal: Neck supple. No muscular tenderness.     Thyroid: No thyromegaly.  Cardiovascular:     Rate and Rhythm: Normal rate and regular rhythm.  Pulmonary:     Effort: No respiratory distress.     Breath sounds: Normal breath sounds. No wheezing.  Abdominal:     General: Bowel sounds are normal.     Palpations: Abdomen is soft.     Tenderness: There is no abdominal tenderness.  Musculoskeletal:        General: No swelling.     Comments: Increased pain to palpation medial and lateral - elbow.  No pain with flexion.    Lymphadenopathy:     Cervical: No cervical adenopathy.  Skin:    Findings: No erythema or rash.  Neurological:     Mental Status: She is alert.  Psychiatric:        Mood and Affect: Mood normal.        Behavior: Behavior normal.     BP 110/80    Pulse 73    Temp 97.8 F (36.6 C) (Oral)    Wt 160 lb 6.4 oz (72.8 kg)    SpO2 98%    BMI 25.17 kg/m  Wt Readings from Last 3 Encounters:  07/05/18 160 lb 6.4 oz (72.8 kg)  04/16/18 155 lb  6.4 oz (70.5 kg)  06/18/17 149 lb 12.8 oz (67.9 kg)     Lab Results  Component Value Date   WBC 7.0 04/20/2018   HGB 13.5 04/20/2018   HCT 39.9 04/20/2018   PLT 318.0 04/20/2018   GLUCOSE 107 (H) 04/20/2018   CHOL 199 04/20/2018   TRIG 161.0 (H) 04/20/2018   HDL 51.90 04/20/2018   LDLCALC 115 (H) 04/20/2018   ALT 31 04/20/2018   AST 26 04/20/2018   NA 139 04/20/2018   K 4.0 04/20/2018   CL 104 04/20/2018   CREATININE 0.81 04/20/2018   BUN 13 04/20/2018   CO2 27 04/20/2018   TSH 2.52 04/20/2018   HGBA1C 5.6 05/25/2018    US Breast Ltd Uni Left Inc Axilla  Result Date: 04/20/2018 CLINICAL DATA:  Left breast upper outer quadrant questionable distortion with associated microcalcifications. EXAM: DIGITAL DIAGNOSTIC LEFT MAMMOGRAM WITH CAD AND TOMO ULTRASOUND LEFT BREAST COMPARISON:  Previous exam(s). ACR Breast Density Category c: The breast tissue is heterogeneously dense, which may obscure small masses. FINDINGS: Additional mammographic views of the left breast demonstrate no suspicious masses or areas of architectural distortion. The previously questioned calcifications in the outer left breast, far posterior depth, represent benign vascular calcifications. The asymmetry in the left breast upper outer quadrant effaces to glandular tissue. Low-density circumscribed masses are suggestive of benign cysts. Mammographic images were processed with CAD. On physical exam, no suspicious masses are palpated. Targeted ultrasound is performed, showing 3 benign-appearing cysts in the left breast 2 o'clock 1 cm from the nipple, 3 o'clock 2 cm from the nipple and retroareolar 3 o'clock breast. No suspicious masses or shadowing lesions. IMPRESSION: No mammographic or sonographic evidence of malignancy in the left breast. RECOMMENDATION: Screening mammogram in one year.(Code:SM-B-01Y) I have discussed the findings and recommendations with the patient. Results were also provided in writing at the  conclusion of the visit. If applicable, a reminder letter will be sent to the patient regarding the next appointment. BI-RADS CATEGORY  2: Benign. Electronically Signed   By: Fidela Salisbury M.D.   On: 04/20/2018 10:27   Mm Diag Breast Tomo Uni Left  Result Date: 04/20/2018 CLINICAL DATA:  Left breast upper outer quadrant questionable distortion with associated microcalcifications. EXAM: DIGITAL DIAGNOSTIC LEFT MAMMOGRAM WITH CAD AND TOMO ULTRASOUND LEFT BREAST COMPARISON:  Previous exam(s). ACR Breast Density Category c: The breast tissue is heterogeneously dense, which may obscure small masses. FINDINGS: Additional mammographic views of the left breast demonstrate no suspicious masses or areas of architectural distortion. The previously questioned calcifications in the outer left breast, far posterior depth, represent benign vascular calcifications. The asymmetry in the left breast upper outer quadrant effaces to glandular tissue. Low-density circumscribed masses are suggestive of benign cysts. Mammographic images were processed with CAD. On physical exam, no suspicious masses are palpated. Targeted ultrasound is performed, showing 3 benign-appearing cysts in the left breast 2 o'clock 1 cm from the nipple, 3 o'clock 2 cm from the nipple and retroareolar 3 o'clock breast. No suspicious masses or shadowing lesions. IMPRESSION: No mammographic or sonographic evidence of malignancy in the left breast. RECOMMENDATION: Screening mammogram in one year.(Code:SM-B-01Y) I have discussed the findings and recommendations with the patient. Results were also provided in writing at the conclusion of the visit. If applicable, a reminder letter will be sent to the patient regarding the next appointment. BI-RADS CATEGORY  2: Benign. Electronically Signed   By: Fidela Salisbury M.D.   On: 04/20/2018 10:27       Assessment & Plan:   Problem List Items Addressed This Visit    Elbow tendinitis  Elbow strap.  Call  with update.  May need ortho referral.        Skin tag    Request referral to dermatology.        Relevant Orders   Ambulatory referral to Dermatology   Sleeping difficulties    Discussed with her today.  She feels needs something to help. Start trazodone as directed.  Follow.        Tobacco abuse    Has quit smoking.  Complete 3 months of chantix.  Continue wellbutrin.  Follow.        Vitamin D deficiency    Follow vitamin D level.         Other Visit Diagnoses    Hyperglycemia    -  Primary   Relevant Orders   Hemoglobin A1c   Hepatic function panel   Basic metabolic panel   Colon cancer screening       Relevant Orders   Ambulatory referral to Gastroenterology   Screening cholesterol level       Relevant Orders   Lipid panel       Einar Pheasant, MD

## 2018-07-07 ENCOUNTER — Encounter: Payer: Self-pay | Admitting: Internal Medicine

## 2018-07-07 DIAGNOSIS — M778 Other enthesopathies, not elsewhere classified: Secondary | ICD-10-CM | POA: Insufficient documentation

## 2018-07-07 DIAGNOSIS — L918 Other hypertrophic disorders of the skin: Secondary | ICD-10-CM | POA: Insufficient documentation

## 2018-07-07 DIAGNOSIS — Z720 Tobacco use: Secondary | ICD-10-CM | POA: Insufficient documentation

## 2018-07-07 NOTE — Assessment & Plan Note (Signed)
Follow vitamin D level.  

## 2018-07-07 NOTE — Assessment & Plan Note (Signed)
Elbow strap.  Call with update.  May need ortho referral.

## 2018-07-07 NOTE — Assessment & Plan Note (Signed)
Request referral to dermatology.  

## 2018-07-07 NOTE — Assessment & Plan Note (Signed)
Has quit smoking.  Complete 3 months of chantix.  Continue wellbutrin.  Follow.

## 2018-07-07 NOTE — Assessment & Plan Note (Signed)
Discussed with her today.  She feels needs something to help. Start trazodone as directed.  Follow.

## 2018-07-20 ENCOUNTER — Encounter: Payer: Self-pay | Admitting: Internal Medicine

## 2018-07-20 ENCOUNTER — Ambulatory Visit (INDEPENDENT_AMBULATORY_CARE_PROVIDER_SITE_OTHER): Payer: 59 | Admitting: Internal Medicine

## 2018-07-20 DIAGNOSIS — J019 Acute sinusitis, unspecified: Secondary | ICD-10-CM

## 2018-07-20 MED ORDER — AMOXICILLIN 875 MG PO TABS: 875.0000 mg | ORAL_TABLET | Freq: Two times a day (BID) | ORAL | 0 refills | Status: DC

## 2018-07-20 NOTE — Progress Notes (Addendum)
Patient ID: Kelly Lowery, female   DOB: 1970/06/19, 48 y.o.   MRN: [id number] Virtual Visit via Video Note  This visit type was conducted due to national recommendations for restrictions regarding the COVID-19 pandemic (e.g. social distancing).  This format is felt to be most appropriate for this patient at this time.  All issues noted in this document were discussed and addressed.  No physical exam was performed (except for noted visual exam findings with Video Visits).   I connected with Kelly Lowery on 07/20/18 at  3:00 PM EDT by a video enabled telemedicine application.  Verified that I am speaking with the correct person using two identifiers. Location patient: home Location provider: work Persons participating in the virtual visit: patient, provider  I discussed the limitations, risks, security and privacy concerns of performing an evaluation and management service by video. The patient expressed understanding and agreed to proceed.   Reason for visit: work in appt.    HPI: She reports that she has been having some allergy symptoms.  Over this past week, symptoms have worsened.  Reports pain into her teeth.  Right ear full and then to left ear.  Increased sinus pressure.  Feels similar to her sinus infections she has had previously.  No chest congestion.  No increased cough.  No sob.  No acid reflux reported.  No vomiting.  No diarrhea.  Using nasacort, salt water flushes and taking claritin.  Drinking warm water.  Symptoms progressing.  No known COVID exposure.  No fever.     ROS: See pertinent positives and negatives per HPI.  Past Medical History:  Diagnosis Date  . Breast cyst    s/p aspiration  . Hypertension     Past Surgical History:  Procedure Laterality Date  . BREAST CYST ASPIRATION    . CESAREAN SECTION     two  . PARTIAL HYSTERECTOMY  12/30/2013  . TONSILLECTOMY  1974    Family History  Problem Relation Age of Onset  . Breast cancer Maternal Grandmother 47     47 and 60  . Lung cancer Paternal Aunt     SOCIAL HX: reviewed.    Current Outpatient Medications:  .  albuterol (PROVENTIL HFA;VENTOLIN HFA) 108 (90 Base) MCG/ACT inhaler, Inhale 2 puffs into the lungs every 6 (six) hours as needed for wheezing or shortness of breath., Disp: 1 Inhaler, Rfl: 2 .  amoxicillin (AMOXIL) 875 MG tablet, Take 1 tablet (875 mg total) by mouth 2 (two) times daily., Disp: 20 tablet, Rfl: 0 .  aspirin EC 81 MG tablet, Take 81 mg by mouth daily., Disp: , Rfl:  .  Azelastine HCl (ASTEPRO) 0.15 % SOLN, Place 2 sprays into the nose daily. 1x per day, Disp: 30 mL, Rfl: 1 .  buPROPion (WELLBUTRIN XL) 150 MG 24 hr tablet, Take 1 tablet (150 mg total) by mouth daily., Disp: 30 tablet, Rfl: 2 .  Calcium Carbonate-Vit D-Min (CALCIUM 1200 PO), Take by mouth., Disp: , Rfl:  .  CHANTIX 1 MG tablet, TAKE 1 TABLET BY MOUTH TWICE A DAY, Disp: 60 tablet, Rfl: 1 .  cyanocobalamin 1000 MCG tablet, Take 1,000 mcg by mouth daily., Disp: , Rfl:  .  Multiple Vitamin (MULTIVITAMIN) capsule, Take 1 capsule by mouth daily., Disp: , Rfl:  .  traZODone (DESYREL) 50 MG tablet, Take 0.5-1 tablets (25-50 mg total) by mouth at bedtime as needed for sleep., Disp: 30 tablet, Rfl: 1  EXAM:  VITALS per patient if applicable:  GENERAL: alert, oriented,  appears well and in no acute distress  HEENT: atraumatic, conjunttiva clear, no obvious abnormalities on inspection of external nose and ears  NECK: normal movements of the head and neck  LUNGS: on inspection no signs of respiratory distress, breathing rate appears normal, no obvious gross SOB, gasping or wheezing  CV: no obvious cyanosis  PSYCH/NEURO: pleasant and cooperative, no obvious depression or anxiety, speech and thought processing grossly intact  ASSESSMENT AND PLAN:  Discussed the following assessment and plan:  Acute sinusitis, recurrence not specified, unspecified location  Sinusitis Symptoms progressing over this week as  outlined.  Continue nasacort and saline flushes.  Hold claritin temporarily.  mucinex as directed.  Start amoxicillin 875mg  bid.  Probiotic as directed.  Follow.      I discussed the assessment and treatment plan with the patient. The patient was provided an opportunity to ask questions and all were answered. The patient agreed with the plan and demonstrated an understanding of the instructions.   The patient was advised to call back or seek an in-person evaluation if the symptoms worsen or if the condition fails to improve as anticipated.    Einar Pheasant, MD

## 2018-07-21 ENCOUNTER — Telehealth: Payer: Self-pay | Admitting: Gastroenterology

## 2018-07-21 NOTE — Telephone Encounter (Signed)
Gastroenterology Pre-Procedure Review  Request Date: Molli Knock 10/25/2018   [facility] Requesting Physician: Dr. Bonna Gains  PATIENT REVIEW QUESTIONS: The patient responded to the following health history questions as indicated:    1. Are you having any GI issues? no 2. Do you have a personal history of Polyps? no 3. Do you have a family history of Colon Cancer or Polyps? no 4. Diabetes Mellitus? no 5. Joint replacements in the past 12 months? NO  6. Major health problems in the past 3 months?no 7. Any artificial heart valves, MVP, or defibrillator?no    MEDICATIONS & ALLERGIES:    Patient reports the following regarding taking any anticoagulation/antiplatelet therapy:   Plavix, Coumadin, Eliquis, Xarelto, Lovenox, Pradaxa, Brilinta, or Effient? no Aspirin? no  Patient confirms/reports the following medications:  Current Outpatient Medications  Medication Sig Dispense Refill  . albuterol (PROVENTIL HFA;VENTOLIN HFA) 108 (90 Base) MCG/ACT inhaler Inhale 2 puffs into the lungs every 6 (six) hours as needed for wheezing or shortness of breath. 1 Inhaler 2  . amoxicillin (AMOXIL) 875 MG tablet Take 1 tablet (875 mg total) by mouth 2 (two) times daily. 20 tablet 0  . aspirin EC 81 MG tablet Take 81 mg by mouth daily.    . Azelastine HCl (ASTEPRO) 0.15 % SOLN Place 2 sprays into the nose daily. 1x per day 30 mL 1  . buPROPion (WELLBUTRIN XL) 150 MG 24 hr tablet Take 1 tablet (150 mg total) by mouth daily. 30 tablet 2  . Calcium Carbonate-Vit D-Min (CALCIUM 1200 PO) Take by mouth.    . CHANTIX 1 MG tablet TAKE 1 TABLET BY MOUTH TWICE A DAY 60 tablet 1  . cyanocobalamin 1000 MCG tablet Take 1,000 mcg by mouth daily.    . Multiple Vitamin (MULTIVITAMIN) capsule Take 1 capsule by mouth daily.    . traZODone (DESYREL) 50 MG tablet Take 0.5-1 tablets (25-50 mg total) by mouth at bedtime as needed for sleep. 30 tablet 1   No current facility-administered medications for this visit.     Patient  confirms/reports the following allergies:  Allergies  Allergen Reactions  . Doxycycline     Hives   . Levaquin [Levofloxacin] Other (See Comments)    Joint pain  . Keflex [Cephalexin] Rash    No orders of the defined types were placed in this encounter.   AUTHORIZATION INFORMATION Primary Insurance: 1D#: Group #:  Secondary Insurance: 1D#: Group #:  SCHEDULE INFORMATION: Date:  Time: Location:

## 2018-07-22 ENCOUNTER — Other Ambulatory Visit: Payer: Self-pay

## 2018-07-22 DIAGNOSIS — Z1211 Encounter for screening for malignant neoplasm of colon: Secondary | ICD-10-CM

## 2018-07-24 ENCOUNTER — Encounter: Payer: Self-pay | Admitting: Internal Medicine

## 2018-07-24 NOTE — Assessment & Plan Note (Signed)
Symptoms progressing over this week as outlined.  Continue nasacort and saline flushes.  Hold claritin temporarily.  mucinex as directed.  Start amoxicillin 875mg  bid.  Probiotic as directed.  Follow.

## 2018-08-28 ENCOUNTER — Other Ambulatory Visit: Payer: Self-pay | Admitting: Internal Medicine

## 2018-09-25 ENCOUNTER — Other Ambulatory Visit: Payer: Self-pay | Admitting: Internal Medicine

## 2018-09-27 ENCOUNTER — Other Ambulatory Visit: Payer: Self-pay

## 2018-09-27 ENCOUNTER — Other Ambulatory Visit (INDEPENDENT_AMBULATORY_CARE_PROVIDER_SITE_OTHER): Payer: 59

## 2018-09-27 DIAGNOSIS — Z13220 Encounter for screening for lipoid disorders: Secondary | ICD-10-CM

## 2018-09-27 DIAGNOSIS — R739 Hyperglycemia, unspecified: Secondary | ICD-10-CM

## 2018-09-27 LAB — BASIC METABOLIC PANEL
BUN: 19 mg/dL (ref 6–23)
CO2: 24 mEq/L (ref 19–32)
Calcium: 8.9 mg/dL (ref 8.4–10.5)
Chloride: 108 mEq/L (ref 96–112)
Creatinine, Ser: 0.82 mg/dL (ref 0.40–1.20)
GFR: 73.76 mL/min (ref >60.00)
Glucose, Bld: 105 mg/dL — ABNORMAL HIGH (ref 70–99)
Potassium: 4.2 mEq/L (ref 3.5–5.1)
Sodium: 140 mEq/L (ref 135–145)

## 2018-09-27 LAB — LIPID PANEL
Cholesterol: 186 mg/dL (ref 0–200)
HDL: 53.6 mg/dL (ref >39.00)
LDL Cholesterol: 105 mg/dL — ABNORMAL HIGH (ref 0–99)
NonHDL: 132.55
Total CHOL/HDL Ratio: 3
Triglycerides: 139 mg/dL (ref 0.0–149.0)
VLDL: 27.8 mg/dL (ref 0.0–40.0)

## 2018-09-27 LAB — HEPATIC FUNCTION PANEL
ALT: 22 U/L (ref 0–35)
AST: 18 U/L (ref 0–37)
Albumin: 4.1 g/dL (ref 3.5–5.2)
Alkaline Phosphatase: 79 U/L (ref 39–117)
Bilirubin, Direct: 0 mg/dL (ref 0.0–0.3)
Total Bilirubin: 0.3 mg/dL (ref 0.2–1.2)
Total Protein: 6.3 g/dL (ref 6.0–8.3)

## 2018-09-27 LAB — HEMOGLOBIN A1C: Hgb A1c MFr Bld: 5.5 % (ref 4.6–6.5)

## 2018-10-04 ENCOUNTER — Other Ambulatory Visit: Payer: Self-pay | Admitting: Internal Medicine

## 2018-10-05 ENCOUNTER — Ambulatory Visit (INDEPENDENT_AMBULATORY_CARE_PROVIDER_SITE_OTHER): Payer: 59 | Admitting: Internal Medicine

## 2018-10-05 ENCOUNTER — Other Ambulatory Visit: Payer: Self-pay

## 2018-10-05 DIAGNOSIS — E559 Vitamin D deficiency, unspecified: Secondary | ICD-10-CM

## 2018-10-05 DIAGNOSIS — M778 Other enthesopathies, not elsewhere classified: Secondary | ICD-10-CM | POA: Diagnosis not present

## 2018-10-05 DIAGNOSIS — G479 Sleep disorder, unspecified: Secondary | ICD-10-CM | POA: Diagnosis not present

## 2018-10-05 NOTE — Progress Notes (Signed)
Patient ID: Kelly Lowery, female   DOB: 10/21/1970, 48 y.o.   MRN: [id number]   Virtual Visit via video Note  This visit type was conducted due to national recommendations for restrictions regarding the COVID-19 pandemic (e.g. social distancing).  This format is felt to be most appropriate for this patient at this time.  All issues noted in this document were discussed and addressed.  No physical exam was performed (except for noted visual exam findings with Video Visits).   I connected with Jon Gills by a video enabled telemedicine application or telephone and verified that I am speaking with the correct person using two identifiers. Location patient: home Location provider: work  Persons participating in the virtual visit: patient, provider  I discussed the limitations, risks, security and privacy concerns of performing an evaluation and management service by video and the availability of in person appointments. The patient expressed understanding and agreed to proceed.   Reason for visit: scheduled follow up.    HPI: She reports she is doing relatively well.  Still not smoking.  Increased stress.  Discussed with her today.  Taking wellbutrin.  Discussed sleep.  Discussed counseling.  She has good support.  Trying to stay in due to covid restrictions.  No fever.  No sob.  No chest congestion, chest pain or sob.  No acid reflux.  No abdominal pain.  Bowels moving.  Right elbow better.     ROS: See pertinent positives and negatives per HPI.  Past Medical History:  Diagnosis Date  . Breast cyst    s/p aspiration  . Hypertension     Past Surgical History:  Procedure Laterality Date  . BREAST CYST ASPIRATION    . CESAREAN SECTION     two  . PARTIAL HYSTERECTOMY  12/30/2013  . TONSILLECTOMY  1974    Family History  Problem Relation Age of Onset  . Breast cancer Maternal Grandmother 47       47 and 60  . Lung cancer Paternal Aunt     SOCIAL HX: reviewed.    Current  Outpatient Medications:  .  albuterol (PROVENTIL HFA;VENTOLIN HFA) 108 (90 Base) MCG/ACT inhaler, Inhale 2 puffs into the lungs every 6 (six) hours as needed for wheezing or shortness of breath., Disp: 1 Inhaler, Rfl: 2 .  aspirin EC 81 MG tablet, Take 81 mg by mouth daily., Disp: , Rfl:  .  Azelastine HCl (ASTEPRO) 0.15 % SOLN, Place 2 sprays into the nose daily. 1x per day, Disp: 30 mL, Rfl: 1 .  buPROPion (WELLBUTRIN XL) 150 MG 24 hr tablet, TAKE 1 TABLET BY MOUTH EVERY DAY, Disp: 90 tablet, Rfl: 0 .  Calcium Carbonate-Vit D-Min (CALCIUM 1200 PO), Take by mouth., Disp: , Rfl:  .  cyanocobalamin 1000 MCG tablet, Take 1,000 mcg by mouth daily., Disp: , Rfl:  .  Multiple Vitamin (MULTIVITAMIN) capsule, Take 1 capsule by mouth daily., Disp: , Rfl:  .  traZODone (DESYREL) 50 MG tablet, TAKE 1/2 TO 1 TABLET BY MOUTH AT BEDTIME AS NEEDED FOR SLEEP, Disp: 90 tablet, Rfl: 1  EXAM:  GENERAL: alert, oriented, appears well and in no acute distress  HEENT: atraumatic, conjunttiva clear, no obvious abnormalities on inspection of external nose and ears  NECK: normal movements of the head and neck  LUNGS: on inspection no signs of respiratory distress, breathing rate appears normal, no obvious gross SOB, gasping or wheezing  CV: no obvious cyanosis  PSYCH/NEURO: pleasant and cooperative, no obvious depression or anxiety, speech  and thought processing grossly intact  ASSESSMENT AND PLAN:  Discussed the following assessment and plan:  Elbow tendinitis Elbow better.  Follow.    Sleeping difficulties Discussed increased stress, anxiety and sleep issues.  Discussed counseling.  Does not feel needs any further intervention.  Follow.    Vitamin D deficiency Follow vitamin d level.      I discussed the assessment and treatment plan with the patient. The patient was provided an opportunity to ask questions and all were answered. The patient agreed with the plan and demonstrated an understanding of  the instructions.   The patient was advised to call back or seek an in-person evaluation if the symptoms worsen or if the condition fails to improve as anticipated.   Einar Pheasant, MD

## 2018-10-09 ENCOUNTER — Encounter: Payer: Self-pay | Admitting: Internal Medicine

## 2018-10-09 NOTE — Assessment & Plan Note (Signed)
Discussed increased stress, anxiety and sleep issues.  Discussed counseling.  Does not feel needs any further intervention.  Follow.

## 2018-10-09 NOTE — Assessment & Plan Note (Signed)
Follow vitamin d level.   

## 2018-10-09 NOTE — Assessment & Plan Note (Signed)
Elbow better.  Follow.

## 2018-10-25 ENCOUNTER — Ambulatory Visit: Admit: 2018-10-25 | Payer: 59 | Admitting: Gastroenterology

## 2018-10-25 SURGERY — COLONOSCOPY WITH PROPOFOL
Anesthesia: General

## 2018-12-26 ENCOUNTER — Other Ambulatory Visit: Payer: Self-pay | Admitting: Internal Medicine

## 2019-01-13 ENCOUNTER — Telehealth: Payer: Self-pay

## 2019-01-13 NOTE — Telephone Encounter (Signed)
LVM returning patients call to schedule her colonoscopy.  Asked her to call the office back to schedule, and also made her aware that we are still requiring all patients to be tested for COVID.  COVID testing date will be given upon scheduling colonoscopy.  Thanks [employer]

## 2019-02-07 ENCOUNTER — Other Ambulatory Visit: Payer: Self-pay

## 2019-02-09 ENCOUNTER — Ambulatory Visit (INDEPENDENT_AMBULATORY_CARE_PROVIDER_SITE_OTHER): Payer: 59

## 2019-02-09 ENCOUNTER — Other Ambulatory Visit: Payer: Self-pay

## 2019-02-09 DIAGNOSIS — Z23 Encounter for immunization: Secondary | ICD-10-CM | POA: Diagnosis not present

## 2019-02-15 ENCOUNTER — Ambulatory Visit (INDEPENDENT_AMBULATORY_CARE_PROVIDER_SITE_OTHER): Payer: 59 | Admitting: Internal Medicine

## 2019-02-15 ENCOUNTER — Other Ambulatory Visit: Payer: Self-pay

## 2019-02-15 DIAGNOSIS — G479 Sleep disorder, unspecified: Secondary | ICD-10-CM

## 2019-02-15 DIAGNOSIS — E559 Vitamin D deficiency, unspecified: Secondary | ICD-10-CM | POA: Diagnosis not present

## 2019-02-15 DIAGNOSIS — K219 Gastro-esophageal reflux disease without esophagitis: Secondary | ICD-10-CM

## 2019-02-15 DIAGNOSIS — G5602 Carpal tunnel syndrome, left upper limb: Secondary | ICD-10-CM

## 2019-02-15 DIAGNOSIS — Z1211 Encounter for screening for malignant neoplasm of colon: Secondary | ICD-10-CM

## 2019-02-15 MED ORDER — BUPROPION HCL ER (XL) 150 MG PO TB24: 150.0000 mg | ORAL_TABLET | Freq: Every day | ORAL | 1 refills | Status: DC

## 2019-02-15 NOTE — Progress Notes (Addendum)
Patient ID: Kelly Lowery, female   DOB: 08-31-1970, 48 y.o.   MRN: [id number]   Virtual Visit via video Note  This visit type was conducted due to national recommendations for restrictions regarding the COVID-19 pandemic (e.g. social distancing).  This format is felt to be most appropriate for this patient at this time.  All issues noted in this document were discussed and addressed.  No physical exam was performed (except for noted visual exam findings with Video Visits).   I connected with Giavonni Ninneman by a video enabled telemedicine application and verified that I am speaking with the correct person using two identifiers. Location patient: home Location provider: work Persons participating in the virtual visit: patient, provider  I discussed the limitations, risks, security and privacy concerns of performing an evaluation and management service by telephone and the availability of in person appointments.  The patient expressed understanding and agreed to proceed.   Reason for visit: work in appt for hand pain and f/u  HPI: She reports persistent problems with her hand/fingers.  Feels like her index finger and thumb are asleep.  Increased discomfort with use.  Wearing a splint.  Has not helped.  Has also noticed some acid reflux.  No chest pain. Tries to stay active.  No increased cough, congestion or sob.  Minimal nasal congestion.  No headache.  Stopped smoking 9 months ago.  Taking wellbutrin.  Doing well with this medication.  Would like to remain on wellbutrin.  Sleeping better.     ROS: See pertinent positives and negatives per HPI.  Past Medical History:  Diagnosis Date  . Breast cyst    s/p aspiration  . Hypertension     Past Surgical History:  Procedure Laterality Date  . BREAST CYST ASPIRATION    . CESAREAN SECTION     two  . PARTIAL HYSTERECTOMY  12/30/2013  . TONSILLECTOMY  1974    Family History  Problem Relation Age of Onset  . Breast cancer Maternal  Grandmother 47       47 and 60  . Lung cancer Paternal Aunt     SOCIAL HX: reviewed.    Current Outpatient Medications:  .  albuterol (PROVENTIL HFA;VENTOLIN HFA) 108 (90 Base) MCG/ACT inhaler, Inhale 2 puffs into the lungs every 6 (six) hours as needed for wheezing or shortness of breath., Disp: 1 Inhaler, Rfl: 2 .  aspirin EC 81 MG tablet, Take 81 mg by mouth daily., Disp: , Rfl:  .  Azelastine HCl (ASTEPRO) 0.15 % SOLN, Place 2 sprays into the nose daily. 1x per day, Disp: 30 mL, Rfl: 1 .  buPROPion (WELLBUTRIN XL) 150 MG 24 hr tablet, Take 1 tablet (150 mg total) by mouth daily., Disp: 90 tablet, Rfl: 1 .  Calcium Carbonate-Vit D-Min (CALCIUM 1200 PO), Take by mouth., Disp: , Rfl:  .  cyanocobalamin 1000 MCG tablet, Take 1,000 mcg by mouth daily., Disp: , Rfl:  .  Multiple Vitamin (MULTIVITAMIN) capsule, Take 1 capsule by mouth daily., Disp: , Rfl:  .  traZODone (DESYREL) 50 MG tablet, TAKE 1/2 TO 1 TABLET BY MOUTH AT BEDTIME AS NEEDED FOR SLEEP, Disp: 90 tablet, Rfl: 1  EXAM:  GENERAL: alert, oriented, appears well and in no acute distress  HEENT: atraumatic, conjunttiva clear, no obvious abnormalities on inspection of external nose and ears  NECK: normal movements of the head and neck  LUNGS: on inspection no signs of respiratory distress, breathing rate appears normal, no obvious gross SOB, gasping or wheezing  CV: no obvious cyanosis  PSYCH/NEURO: pleasant and cooperative, no obvious depression or anxiety, speech and thought processing grossly intact  ASSESSMENT AND PLAN:  Discussed the following assessment and plan:  Sleeping difficulties Sleeping better.  On wellbutrin to help quit smoking.  Doing well on this medication.  Follow.    Vitamin D deficiency Follow vitamin d level.   Carpal tunnel syndrome Hand pain, numbness and tingling thumb and index finger as outlined.  Splint not helping.  Refer to ortho for further evaluation.    GERD (gastroesophageal reflux  disease) Start pepcid as directed.  Schedule f/u to reassess response.   Colon cancer screening She rescheduled her colonoscopy.     I discussed the assessment and treatment plan with the patient. The patient was provided an opportunity to ask questions and all were answered. The patient agreed with the plan and demonstrated an understanding of the instructions.   The patient was advised to call back or seek an in-person evaluation if the symptoms worsen or if the condition fails to improve as anticipated.   Einar Pheasant, MD

## 2019-02-17 ENCOUNTER — Telehealth: Payer: Self-pay

## 2019-02-17 NOTE — Telephone Encounter (Signed)
Patient contacted the office to schedule her colonoscopy.  She wanted to schedule for January 2021, explained to her that we are not currently scheduling for January.  I told her I'd call her back after Christmas to schedule for her in the month of January.  Thanks [employer]

## 2019-02-20 ENCOUNTER — Encounter: Payer: Self-pay | Admitting: Internal Medicine

## 2019-02-20 DIAGNOSIS — G5600 Carpal tunnel syndrome, unspecified upper limb: Secondary | ICD-10-CM | POA: Insufficient documentation

## 2019-02-20 DIAGNOSIS — Z1211 Encounter for screening for malignant neoplasm of colon: Secondary | ICD-10-CM | POA: Insufficient documentation

## 2019-02-20 DIAGNOSIS — K219 Gastro-esophageal reflux disease without esophagitis: Secondary | ICD-10-CM | POA: Insufficient documentation

## 2019-02-20 NOTE — Assessment & Plan Note (Signed)
Hand pain, numbness and tingling thumb and index finger as outlined.  Splint not helping.  Refer to ortho for further evaluation.

## 2019-02-20 NOTE — Addendum Note (Signed)
Addended by: Alisa Graff on: 02/20/2019 10:12 AM   Modules accepted: Orders

## 2019-02-20 NOTE — Assessment & Plan Note (Signed)
Start pepcid as directed.  Schedule f/u to reassess response.

## 2019-02-20 NOTE — Assessment & Plan Note (Signed)
Sleeping better.  On wellbutrin to help quit smoking.  Doing well on this medication.  Follow.

## 2019-02-20 NOTE — Assessment & Plan Note (Signed)
She rescheduled her colonoscopy.

## 2019-02-20 NOTE — Assessment & Plan Note (Signed)
Follow vitamin d level.   

## 2019-04-09 ENCOUNTER — Other Ambulatory Visit: Payer: Self-pay | Admitting: Internal Medicine

## 2019-04-12 ENCOUNTER — Other Ambulatory Visit: Payer: Self-pay

## 2019-04-12 ENCOUNTER — Telehealth: Payer: Self-pay

## 2019-04-12 DIAGNOSIS — Z1211 Encounter for screening for malignant neoplasm of colon: Secondary | ICD-10-CM

## 2019-04-12 NOTE — Telephone Encounter (Signed)
-----   Message from Vanetta Mulders, [place] sent at 02/17/2019 10:48 AM EST ----- Regarding: [facility] B[facility] Colonosocpy Request Please call patient after christmas to schedule colonoscopy in January.  Sharyn Lull

## 2019-04-12 NOTE — Telephone Encounter (Signed)
Gastroenterology Pre-Procedure Review  Request Date: 06/16/19 Requesting Physician: Dr. Marius Ditch  PATIENT REVIEW QUESTIONS: The patient responded to the following health history questions as indicated:    1. Are you having any GI issues? no 2. Do you have a personal history of Polyps? no 3. Do you have a family history of Colon Cancer or Polyps? no 4. Diabetes Mellitus? no 5. Joint replacements in the past 12 months?no 6. Major health problems in the past 3 months?no 7. Any artificial heart valves, MVP, or defibrillator?no    MEDICATIONS & ALLERGIES:    Patient reports the following regarding taking any anticoagulation/antiplatelet therapy:   Plavix, Coumadin, Eliquis, Xarelto, Lovenox, Pradaxa, Brilinta, or Effient? no Aspirin? yes (81 mg daily)  Patient confirms/reports the following medications:  Current Outpatient Medications  Medication Sig Dispense Refill  . albuterol (PROVENTIL HFA;VENTOLIN HFA) 108 (90 Base) MCG/ACT inhaler Inhale 2 puffs into the lungs every 6 (six) hours as needed for wheezing or shortness of breath. 1 Inhaler 2  . aspirin EC 81 MG tablet Take 81 mg by mouth daily.    . Azelastine HCl (ASTEPRO) 0.15 % SOLN Place 2 sprays into the nose daily. 1x per day 30 mL 1  . buPROPion (WELLBUTRIN XL) 150 MG 24 hr tablet Take 1 tablet (150 mg total) by mouth daily. 90 tablet 1  . Calcium Carbonate-Vit D-Min (CALCIUM 1200 PO) Take by mouth.    . cyanocobalamin 1000 MCG tablet Take 1,000 mcg by mouth daily.    . Multiple Vitamin (MULTIVITAMIN) capsule Take 1 capsule by mouth daily.    . traZODone (DESYREL) 50 MG tablet TAKE 1/2 TO 1 TABLET BY MOUTH AT BEDTIME AS NEEDED FOR SLEEP 90 tablet 1   No current facility-administered medications for this visit.    Patient confirms/reports the following allergies:  Allergies  Allergen Reactions  . Doxycycline     Hives   . Levaquin [Levofloxacin] Other (See Comments)    Joint pain  . Keflex [Cephalexin] Rash    No orders  of the defined types were placed in this encounter.   AUTHORIZATION INFORMATION Primary Insurance: 1D#: Group #:  Secondary Insurance: 1D#: Group #:  SCHEDULE INFORMATION: Date: 06/16/19 Time: Location:[facility]

## 2019-04-19 ENCOUNTER — Other Ambulatory Visit: Payer: Self-pay | Admitting: Obstetrics & Gynecology

## 2019-04-19 ENCOUNTER — Ambulatory Visit
Admission: RE | Admit: 2019-04-19 | Discharge: 2019-04-19 | Disposition: A | Discharge status: Home / Self Care | Payer: 59 | Source: Ambulatory Visit | Attending: Obstetrics & Gynecology | Admitting: Obstetrics & Gynecology

## 2019-04-19 DIAGNOSIS — Z1231 Encounter for screening mammogram for malignant neoplasm of breast: Secondary | ICD-10-CM

## 2019-04-21 ENCOUNTER — Other Ambulatory Visit: Payer: Self-pay | Admitting: Obstetrics & Gynecology

## 2019-04-21 DIAGNOSIS — N6310 Unspecified lump in the right breast, unspecified quadrant: Secondary | ICD-10-CM

## 2019-04-21 DIAGNOSIS — R928 Other abnormal and inconclusive findings on diagnostic imaging of breast: Secondary | ICD-10-CM

## 2019-04-28 ENCOUNTER — Ambulatory Visit
Admission: RE | Admit: 2019-04-28 | Discharge: 2019-04-28 | Disposition: A | Discharge status: Home / Self Care | Payer: 59 | Source: Ambulatory Visit | Attending: Obstetrics & Gynecology | Admitting: Obstetrics & Gynecology

## 2019-04-28 ENCOUNTER — Other Ambulatory Visit: Payer: Self-pay

## 2019-04-28 ENCOUNTER — Ambulatory Visit (INDEPENDENT_AMBULATORY_CARE_PROVIDER_SITE_OTHER): Payer: 59 | Admitting: Internal Medicine

## 2019-04-28 VITALS — BP 118/68 | HR 71 | Temp 97.0°F | Resp 16 | Ht 66.0 in | Wt 171.4 lb

## 2019-04-28 DIAGNOSIS — N6310 Unspecified lump in the right breast, unspecified quadrant: Secondary | ICD-10-CM | POA: Diagnosis present

## 2019-04-28 DIAGNOSIS — Z13220 Encounter for screening for lipoid disorders: Secondary | ICD-10-CM | POA: Diagnosis not present

## 2019-04-28 DIAGNOSIS — R739 Hyperglycemia, unspecified: Secondary | ICD-10-CM | POA: Insufficient documentation

## 2019-04-28 DIAGNOSIS — R928 Other abnormal and inconclusive findings on diagnostic imaging of breast: Secondary | ICD-10-CM | POA: Insufficient documentation

## 2019-04-28 DIAGNOSIS — Z1211 Encounter for screening for malignant neoplasm of colon: Secondary | ICD-10-CM

## 2019-04-28 DIAGNOSIS — Z0000 Encounter for general adult medical examination without abnormal findings: Secondary | ICD-10-CM | POA: Diagnosis not present

## 2019-04-28 DIAGNOSIS — G5602 Carpal tunnel syndrome, left upper limb: Secondary | ICD-10-CM

## 2019-04-28 LAB — CBC WITH DIFFERENTIAL/PLATELET
Basophils Absolute: 0.1 10*3/uL (ref 0.0–0.1)
Basophils Relative: 0.8 % (ref 0.0–3.0)
Eosinophils Absolute: 0.2 10*3/uL (ref 0.0–0.7)
Eosinophils Relative: 1.6 % (ref 0.0–5.0)
HCT: 39.9 % (ref 36.0–46.0)
Hemoglobin: 13.2 g/dL (ref 12.0–15.0)
Lymphocytes Relative: 28.8 % (ref 12.0–46.0)
Lymphs Abs: 3.2 10*3/uL (ref 0.7–4.0)
MCHC: 33 g/dL (ref 30.0–36.0)
MCV: 90 fl (ref 78.0–100.0)
Monocytes Absolute: 0.7 10*3/uL (ref 0.1–1.0)
Monocytes Relative: 6.5 % (ref 3.0–12.0)
Neutro Abs: 6.9 10*3/uL (ref 1.4–7.7)
Neutrophils Relative %: 62.3 % (ref 43.0–77.0)
Platelets: 320 10*3/uL (ref 150.0–400.0)
RBC: 4.43 Mil/uL (ref 3.87–5.11)
RDW: 13.7 % (ref 11.5–15.5)
WBC: 11 10*3/uL — ABNORMAL HIGH (ref 4.0–10.5)

## 2019-04-28 LAB — HEPATIC FUNCTION PANEL
ALT: 23 U/L (ref 0–35)
AST: 16 U/L (ref 0–37)
Albumin: 4.5 g/dL (ref 3.5–5.2)
Alkaline Phosphatase: 86 U/L (ref 39–117)
Bilirubin, Direct: 0.1 mg/dL (ref 0.0–0.3)
Total Bilirubin: 0.3 mg/dL (ref 0.2–1.2)
Total Protein: 7.5 g/dL (ref 6.0–8.3)

## 2019-04-28 LAB — LIPID PANEL
Cholesterol: 248 mg/dL — ABNORMAL HIGH (ref 0–200)
HDL: 69.1 mg/dL (ref >39.00)
LDL Cholesterol: 154 mg/dL — ABNORMAL HIGH (ref 0–99)
NonHDL: 178.53
Total CHOL/HDL Ratio: 4
Triglycerides: 125 mg/dL (ref 0.0–149.0)
VLDL: 25 mg/dL (ref 0.0–40.0)

## 2019-04-28 LAB — BASIC METABOLIC PANEL
BUN: 24 mg/dL — ABNORMAL HIGH (ref 6–23)
CO2: 26 mEq/L (ref 19–32)
Calcium: 9.7 mg/dL (ref 8.4–10.5)
Chloride: 102 mEq/L (ref 96–112)
Creatinine, Ser: 0.87 mg/dL (ref 0.40–1.20)
GFR: 68.73 mL/min (ref >60.00)
Glucose, Bld: 105 mg/dL — ABNORMAL HIGH (ref 70–99)
Potassium: 4 mEq/L (ref 3.5–5.1)
Sodium: 136 mEq/L (ref 135–145)

## 2019-04-28 LAB — TSH: TSH: 1.82 u[IU]/mL (ref 0.35–4.50)

## 2019-04-28 LAB — HEMOGLOBIN A1C: Hgb A1c MFr Bld: 5.7 % (ref 4.6–6.5)

## 2019-04-28 LAB — SARS-COV-2 IGG: SARS-COV-2 IgG: 0.02

## 2019-04-28 NOTE — Progress Notes (Signed)
Patient ID: Kelly Lowery, female   DOB: Oct 03, 1970, 49 y.o.   MRN: [id number]   Subjective:    Patient ID: Kelly Lowery, female    DOB: [birth year], 49 y.o.   MRN: [id number]  HPI This visit occurred during the SARS-CoV-2 public health emergency.  Safety protocols were in place, including screening questions prior to the visit, additional usage of staff PPE, and extensive cleaning of exam room while observing appropriate contact time as indicated for disinfecting solutions.  Patient here for a scheduled physical exam.  Sees gyn - Dr Leonides Schanz.  Just evaluated 04/19/19 - for breast and pelvic exam.  States she is doing well.  States had covid in 03/2018.  Was not diagnosed.  This was before we knew of "covid".  Had symptoms that were similar to "covid symptoms".  Discussed antibody testing.  She request to have covid antibody test.  Stays active.  No chest pain or sob reported.  No acid reflux reported.  No abdominal pain.  Bowels moving.  Had problems with bilateral carpal tunnel syndrome.  S/p injection.  Doing better.  Has trigger finger.  Is better.  Planning colonoscopy 06/2019.  Has quit smoking. States eating more.  Discussed diet and exercise.  On wellbutrin.     Past Medical History:  Diagnosis Date  . Breast cyst    s/p aspiration  . Hypertension    Past Surgical History:  Procedure Laterality Date  . BREAST CYST ASPIRATION Left   . CESAREAN SECTION     two  . PARTIAL HYSTERECTOMY  12/30/2013  . TONSILLECTOMY  1974   Family History  Problem Relation Age of Onset  . Breast cancer Maternal Grandmother 47       47 and 60  . Lung cancer Paternal Aunt    Social History   Socioeconomic History  . Marital status: Married    Spouse name: Not on file  . Number of children: Not on file  . Years of education: Not on file  . Highest education level: Not on file  Occupational History  . Not on file  Tobacco Use  . Smoking status: Former Smoker    Packs/day: 0.50    Years: 20.00   Pack years: 10.00    Types: Cigarettes  . Smokeless tobacco: Never Used  Substance and Sexual Activity  . Alcohol use: Yes    Comment: occasionally  . Drug use: No  . Sexual activity: Not on file  Other Topics Concern  . Not on file  Social History Narrative   3 dogs labradors   Social Determinants of Health   Financial Resource Strain:   . Difficulty of Paying Living Expenses: Not on file  Food Insecurity:   . Worried About Charity fundraiser in the Last Year: Not on file  . Ran Out of Food in the Last Year: Not on file  Transportation Needs:   . Lack of Transportation (Medical): Not on file  . Lack of Transportation (Non-Medical): Not on file  Physical Activity:   . Days of Exercise per Week: Not on file  . Minutes of Exercise per Session: Not on file  Stress:   . Feeling of Stress : Not on file  Social Connections:   . Frequency of Communication with Friends and Family: Not on file  . Frequency of Social Gatherings with Friends and Family: Not on file  . Attends Religious Services: Not on file  . Active Member of Clubs or Organizations: Not on file  .  Attends Archivist Meetings: Not on file  . Marital Status: Not on file    Outpatient Encounter Medications as of 04/28/2019  Medication Sig  . MELATONIN PO Take by mouth at bedtime.  Marland Kitchen albuterol (PROVENTIL HFA;VENTOLIN HFA) 108 (90 Base) MCG/ACT inhaler Inhale 2 puffs into the lungs every 6 (six) hours as needed for wheezing or shortness of breath.  Marland Kitchen aspirin EC 81 MG tablet Take 81 mg by mouth daily.  . Azelastine HCl (ASTEPRO) 0.15 % SOLN Place 2 sprays into the nose daily. 1x per day  . buPROPion (WELLBUTRIN XL) 150 MG 24 hr tablet Take 1 tablet (150 mg total) by mouth daily.  . Calcium Carbonate-Vit D-Min (CALCIUM 1200 PO) Take by mouth.  . cyanocobalamin 1000 MCG tablet Take 1,000 mcg by mouth daily.  . Multiple Vitamin (MULTIVITAMIN) capsule Take 1 capsule by mouth daily.  . traZODone (DESYREL) 50 MG  tablet TAKE 1/2 TO 1 TABLET BY MOUTH AT BEDTIME AS NEEDED FOR SLEEP   No facility-administered encounter medications on file as of 04/28/2019.    Review of Systems  Constitutional: Negative for appetite change.       Has gained some weight since stopping smoking.    HENT: Negative for congestion and sinus pressure.   Respiratory: Negative for cough, chest tightness and shortness of breath.   Cardiovascular: Negative for chest pain, palpitations and leg swelling.  Gastrointestinal: Negative for abdominal pain, diarrhea, nausea and vomiting.  Genitourinary: Negative for difficulty urinating and dysuria.  Musculoskeletal: Negative for joint swelling and myalgias.  Skin: Negative for color change and rash.  Neurological: Negative for dizziness, light-headedness and headaches.  Psychiatric/Behavioral: Negative for agitation and dysphoric mood.       Objective:    Physical Exam Constitutional:      General: She is not in acute distress.    Appearance: Normal appearance.  HENT:     Head: Normocephalic and atraumatic.     Right Ear: External ear normal.     Left Ear: External ear normal.  Eyes:     General: No scleral icterus.       Right eye: No discharge.        Left eye: No discharge.     Conjunctiva/sclera: Conjunctivae normal.  Neck:     Thyroid: No thyromegaly.  Cardiovascular:     Rate and Rhythm: Normal rate and regular rhythm.  Pulmonary:     Effort: No respiratory distress.     Breath sounds: Normal breath sounds. No wheezing.  Abdominal:     General: Bowel sounds are normal.     Palpations: Abdomen is soft.     Tenderness: There is no abdominal tenderness.  Musculoskeletal:        General: No swelling or tenderness.     Cervical back: Neck supple. No tenderness.  Lymphadenopathy:     Cervical: No cervical adenopathy.  Skin:    Findings: No erythema or rash.  Neurological:     General: No focal deficit present.     Mental Status: She is alert.  Psychiatric:         Behavior: Behavior normal.     BP 118/68   Pulse 71   Temp (!) 97 F (36.1 C)   Resp 16   Ht '5\' 6"'  (1.676 m)   Wt 171 lb 6.4 oz (77.7 kg)   SpO2 98%   BMI 27.66 kg/m  Wt Readings from Last 3 Encounters:  04/28/19 171 lb 6.4 oz (77.7 kg)  07/05/18 160 lb 6.4 oz (72.8 kg)  04/16/18 155 lb 6.4 oz (70.5 kg)     Lab Results  Component Value Date   WBC 11.0 (H) 04/28/2019   HGB 13.2 04/28/2019   HCT 39.9 04/28/2019   PLT 320.0 04/28/2019   GLUCOSE 105 (H) 04/28/2019   CHOL 248 (H) 04/28/2019   TRIG 125.0 04/28/2019   HDL 69.10 04/28/2019   LDLCALC 154 (H) 04/28/2019   ALT 23 04/28/2019   AST 16 04/28/2019   NA 136 04/28/2019   K 4.0 04/28/2019   CL 102 04/28/2019   CREATININE 0.87 04/28/2019   BUN 24 (H) 04/28/2019   CO2 26 04/28/2019   TSH 1.82 04/28/2019   HGBA1C 5.7 04/28/2019    US BREAST LTD UNI RIGHT INC AXILLA  Result Date: 04/28/2019 CLINICAL DATA:  49 year old patient recalled from recent screening mammogram for evaluation of a possible mass in the right breast. EXAM: DIGITAL DIAGNOSTIC RIGHT MAMMOGRAM WITH TOMO ULTRASOUND RIGHT BREAST COMPARISON:  April 19, 2019 and earlier priors ACR Breast Density Category c: The breast tissue is heterogeneously dense, which may obscure small masses. FINDINGS: Spot compression views of the outer right breast confirm a circumscribed oval approximately 1.5 cm mass. On physical exam, I do not palpate a discrete mass in the 9 o'clock axis of the right breast. Targeted ultrasound is performed, showing a circumscribed oval parallel cyst with minimal internal debris at 9 o'clock position 3 cm from the nipple. The cyst measures 1.4 x 1.1 x 1.5 cm. There is no internal vascular flow. Posterior acoustic enhancement present. IMPRESSION: 1.5 cm benign cyst 9 o'clock position right breast accounts for the mass seen on the recent screening mammogram. RECOMMENDATION: Screening mammogram in one year.(Code:SM-B-01Y) I have discussed the  findings and recommendations with the patient. If applicable, a reminder letter will be sent to the patient regarding the next appointment. BI-RADS CATEGORY  2: Benign. Electronically Signed   By: Curlene Dolphin M.D.   On: 04/28/2019 11:04   MM DIAG BREAST TOMO UNI RIGHT  Result Date: 04/28/2019 CLINICAL DATA:  49 year old patient recalled from recent screening mammogram for evaluation of a possible mass in the right breast. EXAM: DIGITAL DIAGNOSTIC RIGHT MAMMOGRAM WITH TOMO ULTRASOUND RIGHT BREAST COMPARISON:  April 19, 2019 and earlier priors ACR Breast Density Category c: The breast tissue is heterogeneously dense, which may obscure small masses. FINDINGS: Spot compression views of the outer right breast confirm a circumscribed oval approximately 1.5 cm mass. On physical exam, I do not palpate a discrete mass in the 9 o'clock axis of the right breast. Targeted ultrasound is performed, showing a circumscribed oval parallel cyst with minimal internal debris at 9 o'clock position 3 cm from the nipple. The cyst measures 1.4 x 1.1 x 1.5 cm. There is no internal vascular flow. Posterior acoustic enhancement present. IMPRESSION: 1.5 cm benign cyst 9 o'clock position right breast accounts for the mass seen on the recent screening mammogram. RECOMMENDATION: Screening mammogram in one year.(Code:SM-B-01Y) I have discussed the findings and recommendations with the patient. If applicable, a reminder letter will be sent to the patient regarding the next appointment. BI-RADS CATEGORY  2: Benign. Electronically Signed   By: Curlene Dolphin M.D.   On: 04/28/2019 11:04       Assessment & Plan:   Problem List Items Addressed This Visit    Abnormal mammogram    Had mammogram 04/19/19. Recommended f/u views.  Just had f/u right breast mammogram - Birads II.  Recommended f/u  in one year.        Carpal tunnel syndrome    S/p injection.  Improved.        Colon cancer screening    Planning for colonoscopy 06/2019.          Health care maintenance    Physical today 04/28/19.  Sees gyn for breast and pelvic exam.  Mammogram as outlined.  Colonoscopy planned for 06/2019.       Relevant Orders   SARS-COV-2 IgG (Completed)   Hyperglycemia    Low carb diet and exercise.  Follow met b and a1c.       Relevant Orders   CBC with Differential/Platelet (Completed)   Hemoglobin A1c (Completed)   TSH (Completed)   Hepatic function panel (Completed)   Basic metabolic panel (Completed)    Other Visit Diagnoses    Screening cholesterol level    -  Primary   Relevant Orders   Lipid panel (Completed)       Einar Pheasant, MD

## 2019-04-30 ENCOUNTER — Encounter: Payer: Self-pay | Admitting: Internal Medicine

## 2019-04-30 NOTE — Assessment & Plan Note (Addendum)
Physical today 04/28/19.  Sees gyn for breast and pelvic exam.  Mammogram as outlined.  Colonoscopy planned for 06/2019.

## 2019-04-30 NOTE — Assessment & Plan Note (Signed)
Planning for colonoscopy 06/2019.

## 2019-04-30 NOTE — Assessment & Plan Note (Signed)
Low carb diet and exercise.  Follow met b and a1c.  

## 2019-04-30 NOTE — Assessment & Plan Note (Signed)
S/p injection.  Improved.

## 2019-04-30 NOTE — Assessment & Plan Note (Signed)
Had mammogram 04/19/19. Recommended f/u views.  Just had f/u right breast mammogram - Birads II.  Recommended f/u in one year.

## 2019-05-04 ENCOUNTER — Other Ambulatory Visit: Payer: Self-pay | Admitting: Internal Medicine

## 2019-05-04 ENCOUNTER — Other Ambulatory Visit: Payer: Self-pay | Admitting: *Deleted

## 2019-05-04 DIAGNOSIS — D72829 Elevated white blood cell count, unspecified: Secondary | ICD-10-CM

## 2019-05-04 NOTE — Progress Notes (Signed)
Order placed for f/u cbc.   

## 2019-05-23 ENCOUNTER — Other Ambulatory Visit: Payer: Self-pay

## 2019-05-25 ENCOUNTER — Other Ambulatory Visit: Payer: Self-pay

## 2019-05-25 ENCOUNTER — Other Ambulatory Visit (INDEPENDENT_AMBULATORY_CARE_PROVIDER_SITE_OTHER): Payer: 59

## 2019-05-25 DIAGNOSIS — D72829 Elevated white blood cell count, unspecified: Secondary | ICD-10-CM

## 2019-05-25 LAB — CBC WITH DIFFERENTIAL/PLATELET
Basophils Absolute: 0.1 10*3/uL (ref 0.0–0.1)
Basophils Relative: 0.7 % (ref 0.0–3.0)
Eosinophils Absolute: 0.1 10*3/uL (ref 0.0–0.7)
Eosinophils Relative: 1.9 % (ref 0.0–5.0)
HCT: 37.6 % (ref 36.0–46.0)
Hemoglobin: 12.7 g/dL (ref 12.0–15.0)
Lymphocytes Relative: 37.5 % (ref 12.0–46.0)
Lymphs Abs: 2.5 10*3/uL (ref 0.7–4.0)
MCHC: 33.8 g/dL (ref 30.0–36.0)
MCV: 89.8 fl (ref 78.0–100.0)
Monocytes Absolute: 0.6 10*3/uL (ref 0.1–1.0)
Monocytes Relative: 9.2 % (ref 3.0–12.0)
Neutro Abs: 3.4 10*3/uL (ref 1.4–7.7)
Neutrophils Relative %: 50.7 % (ref 43.0–77.0)
Platelets: 304 10*3/uL (ref 150.0–400.0)
RBC: 4.19 Mil/uL (ref 3.87–5.11)
RDW: 13.6 % (ref 11.5–15.5)
WBC: 6.8 10*3/uL (ref 4.0–10.5)

## 2019-05-26 ENCOUNTER — Encounter: Payer: Self-pay | Admitting: Internal Medicine

## 2019-05-26 DIAGNOSIS — J0101 Acute recurrent maxillary sinusitis: Secondary | ICD-10-CM

## 2019-06-01 MED ORDER — AZELASTINE HCL 0.15 % NA SOLN: 2.0000 | Freq: Every day | NASAL | 2 refills | Status: DC

## 2019-06-01 MED ORDER — AZELASTINE HCL 0.15 % NA SOLN: 2.0000 | Freq: Every day | NASAL | 1 refills | Status: DC

## 2019-06-01 NOTE — Telephone Encounter (Signed)
rx sent in for astepro

## 2019-06-01 NOTE — Telephone Encounter (Signed)
rx sent in for astepro.

## 2019-06-14 ENCOUNTER — Telehealth: Payer: Self-pay

## 2019-06-14 ENCOUNTER — Other Ambulatory Visit
Admission: RE | Admit: 2019-06-14 | Discharge: 2019-06-14 | Disposition: A | Discharge status: Home / Self Care | Payer: 59 | Source: Ambulatory Visit | Attending: Gastroenterology | Admitting: Gastroenterology

## 2019-06-14 DIAGNOSIS — Z20822 Contact with and (suspected) exposure to covid-19: Secondary | ICD-10-CM | POA: Diagnosis not present

## 2019-06-14 DIAGNOSIS — Z01812 Encounter for preprocedural laboratory examination: Secondary | ICD-10-CM | POA: Diagnosis present

## 2019-06-14 LAB — SARS CORONAVIRUS 2 (TAT 6-24 HRS): SARS Coronavirus 2: NEGATIVE

## 2019-06-14 NOTE — Telephone Encounter (Signed)
[facility] states they do not have any prep but the miralax prep. Called patient and left a message for call back. Want to know if patient wants to come pick up a sample or for it to be called in to another pharmacy

## 2019-06-15 ENCOUNTER — Telehealth: Payer: Self-pay

## 2019-06-15 NOTE — Telephone Encounter (Signed)
Received a voice message yesterday 03/09 from patient requesting to have generic bowel prep due to cost of SuPrep.  Contacted [facility] and all they had available was Miralax.  Verbal rx was given to [facility] in [place].  I then called patient to provide the following instructions: Mix one 238 gram bottle of Miralax with 64 oz of Gatorade.  On the day before procedure mix the miralax and gatorade.  Additionally take 4 dulcolax tablets with water at 2pm.  Starting between 5pm and 6pm Drink 8 oz of solution every 20-63mins until entire contents completed.  Make sure drink plenty of liquids staying hydrated.  Caryl Pina also contacted pt this am  to make sure she received message .  Patient stated she is all ready.  Thanks,  [place], [place]

## 2019-06-15 NOTE — Telephone Encounter (Signed)
Selinda Eon states she took care of this

## 2019-06-15 NOTE — Telephone Encounter (Signed)
Called and left a message for call back  

## 2019-06-16 ENCOUNTER — Ambulatory Visit: Payer: 59 | Admitting: Registered Nurse

## 2019-06-16 ENCOUNTER — Ambulatory Visit
Admission: RE | Admit: 2019-06-16 | Discharge: 2019-06-16 | Disposition: A | Discharge status: Home / Self Care | Payer: 59 | Attending: Gastroenterology | Admitting: Gastroenterology

## 2019-06-16 ENCOUNTER — Other Ambulatory Visit: Payer: Self-pay

## 2019-06-16 ENCOUNTER — Encounter: Payer: Self-pay | Admitting: Gastroenterology

## 2019-06-16 ENCOUNTER — Encounter
Admission: RE | Disposition: A | Discharge status: Home / Self Care | Payer: Self-pay | Source: Home / Self Care | Attending: Gastroenterology

## 2019-06-16 DIAGNOSIS — I10 Essential (primary) hypertension: Secondary | ICD-10-CM | POA: Diagnosis not present

## 2019-06-16 DIAGNOSIS — Z1211 Encounter for screening for malignant neoplasm of colon: Secondary | ICD-10-CM

## 2019-06-16 DIAGNOSIS — K644 Residual hemorrhoidal skin tags: Secondary | ICD-10-CM | POA: Diagnosis not present

## 2019-06-16 DIAGNOSIS — Z87891 Personal history of nicotine dependence: Secondary | ICD-10-CM | POA: Diagnosis not present

## 2019-06-16 DIAGNOSIS — Z79899 Other long term (current) drug therapy: Secondary | ICD-10-CM | POA: Diagnosis not present

## 2019-06-16 HISTORY — DX: Other complications of anesthesia, initial encounter: T88.59XA

## 2019-06-16 HISTORY — PX: COLONOSCOPY WITH PROPOFOL: SHX5780

## 2019-06-16 SURGERY — COLONOSCOPY WITH PROPOFOL
Anesthesia: General

## 2019-06-16 MED ORDER — PROPOFOL 10 MG/ML IV BOLUS
INTRAVENOUS | Status: DC | PRN
  Administered 2019-06-16: 70 mg via INTRAVENOUS

## 2019-06-16 MED ORDER — SODIUM CHLORIDE 0.9 % IV SOLN: INTRAVENOUS | Status: DC

## 2019-06-16 MED ORDER — PROPOFOL 500 MG/50ML IV EMUL
INTRAVENOUS | Status: DC | PRN
  Administered 2019-06-16: 140 ug/kg/min via INTRAVENOUS

## 2019-06-16 NOTE — Transfer of Care (Signed)
Immediate Anesthesia Transfer of Care Note  Patient: Kelly Lowery  Procedure(s) Performed: COLONOSCOPY WITH PROPOFOL (N/A )  Patient Location: PACU  Anesthesia Type:General  Level of Consciousness: sedated  Airway & Oxygen Therapy: Patient Spontanous Breathing  Post-op Assessment: Report given to RN and Post -op Vital signs reviewed and stable  Post vital signs: Reviewed and stable  Last Vitals:  Vitals Value Taken Time  BP    Temp    Pulse    Resp    SpO2      Last Pain:  Vitals:   06/16/19 0958  TempSrc:   PainSc: 0-No pain         Complications: No apparent anesthesia complications

## 2019-06-16 NOTE — Op Note (Signed)
[facility] Patient Name: Kelly Lowery Procedure Date: 06/16/2019 11:22 AM MRN: [id number] Account #: [id number] Date of Birth: 1970-08-04 Admit Type: Outpatient Age: 49 Room: [facility] ENDO ROOM 4 Gender: Female Note Status: Finalized Procedure:             Colonoscopy Indications:           Screening for colorectal malignant neoplasm, This is                         the patient's first colonoscopy Providers:             Lin Landsman MD, MD Referring MD:          Einar Pheasant, MD (Referring MD) Medicines:             Monitored Anesthesia Care Complications:         No immediate complications. Estimated blood loss: None. Procedure:             Pre-Anesthesia Assessment:                        - Prior to the procedure, a History and Physical was                         performed, and patient medications and allergies were                         reviewed. The patient is competent. The risks and                         benefits of the procedure and the sedation options and                         risks were discussed with the patient. All questions                         were answered and informed consent was obtained.                         Patient identification and proposed procedure were                         verified by the physician, the nurse, the                         anesthesiologist, the anesthetist and the technician                         in the pre-procedure area in the procedure room in the                         endoscopy suite. Mental Status Examination: alert and                         oriented. Airway Examination: normal oropharyngeal                         airway and neck mobility. Respiratory Examination:  clear to auscultation. CV Examination: normal.                         Prophylactic Antibiotics: The patient does not require                         prophylactic antibiotics. Prior  Anticoagulants: The                         patient has taken no previous anticoagulant or                         antiplatelet agents. ASA Grade Assessment: II - A                         patient with mild systemic disease. After reviewing                         the risks and benefits, the patient was deemed in                         satisfactory condition to undergo the procedure. The                         anesthesia plan was to use monitored anesthesia care                         (MAC). Immediately prior to administration of                         medications, the patient was re-assessed for adequacy                         to receive sedatives. The heart rate, respiratory                         rate, oxygen saturations, blood pressure, adequacy of                         pulmonary ventilation, and response to care were                         monitored throughout the procedure. The physical                         status of the patient was re-assessed after the                         procedure.                        After obtaining informed consent, the colonoscope was                         passed under direct vision. Throughout the procedure,                         the patient's blood pressure, pulse, and oxygen  saturations were monitored continuously. The                         Colonoscope was introduced through the anus and                         advanced to the the cecum, identified by appendiceal                         orifice and ileocecal valve. The colonoscopy was                         extremely difficult due to significant looping and a                         tortuous colon. Successful completion of the procedure                         was aided by applying abdominal pressure. The patient                         tolerated the procedure well. The quality of the bowel                         preparation was evaluated using the BBPS  [facility] Bowel                         Preparation Scale) with scores of: Right Colon = 3,                         Transverse Colon = 3 and Left Colon = 3 (entire mucosa                         seen well with no residual staining, small fragments                         of stool or opaque liquid). The total BBPS score                         equals 9. Findings:      The perianal and digital rectal examinations were normal. Pertinent       negatives include normal sphincter tone and no palpable rectal lesions.      Skin tags were found on perianal exam.      The entire examined colon appeared normal.      The retroflexed view of the distal rectum and anal verge was normal and       showed no anal or rectal abnormalities. Impression:            - Perianal skin tags found on perianal exam.                        - The entire examined colon is normal.                        - The distal rectum and anal verge are normal on  retroflexion view.                        - No specimens collected. Recommendation:        - Discharge patient to home (with escort).                        - Resume previous diet today.                        - Continue present medications.                        - Repeat colonoscopy in 10 years for surveillance. Procedure Code(s):     --- Professional ---                        S3419, Colorectal cancer screening; colonoscopy on                         individual not meeting criteria for high risk Diagnosis Code(s):     --- Professional ---                        Z12.11, Encounter for screening for malignant neoplasm                         of colon                        K64.4, Residual hemorrhoidal skin tags CPT copyright 2019 American Medical Association. All rights reserved. The codes documented in this report are preliminary and upon coder review may  be revised to meet current compliance requirements. Dr. Ulyess Mort Lin Landsman MD,  MD 06/16/2019 11:47:55 AM This report has been signed electronically. Number of Addenda: 0 Note Initiated On: 06/16/2019 11:22 AM Scope Withdrawal Time: 0 hours 9 minutes 44 seconds  Total Procedure Duration: 0 hours 18 minutes 5 seconds  Estimated Blood Loss:  Estimated blood loss: none.      [facility]

## 2019-06-16 NOTE — Anesthesia Postprocedure Evaluation (Signed)
Anesthesia Post Note  Patient: Photographer  Procedure(s) Performed: COLONOSCOPY WITH PROPOFOL (N/A )  Patient location during evaluation: PACU Anesthesia Type: General Level of consciousness: awake and alert Pain management: pain level controlled Vital Signs Assessment: post-procedure vital signs reviewed and stable Respiratory status: spontaneous breathing, nonlabored ventilation and respiratory function stable Cardiovascular status: blood pressure returned to baseline and stable Postop Assessment: no apparent nausea or vomiting Anesthetic complications: no     Last Vitals:  Vitals:   06/16/19 1210 06/16/19 1220  BP: (!) 130/96 (!) 142/96  Pulse: (!) 57 (!) 54  Resp: 11 (!) 8  Temp:    SpO2: 100% 100%    Last Pain:  Vitals:   06/16/19 1220  TempSrc:   PainSc: 0-No pain                 Brett Canales Jenese Mischke

## 2019-06-16 NOTE — Anesthesia Preprocedure Evaluation (Addendum)
Anesthesia Evaluation  Patient identified by MRN, date of birth, ID band Patient awake    Reviewed: Allergy & Precautions, H&P , NPO status , Patient's Chart, lab work & pertinent test results  History of Anesthesia Complications Negative for: history of anesthetic complications  Airway Mallampati: II  TM Distance: >3 FB Neck ROM: full    Dental  (+) Teeth Intact   Pulmonary former smoker,           Cardiovascular hypertension,      Neuro/Psych negative neurological ROS  negative psych ROS   GI/Hepatic Neg liver ROS, GERD  ,  Endo/Other  negative endocrine ROS  Renal/GU negative Renal ROS  negative genitourinary   Musculoskeletal   Abdominal   Peds  Hematology negative hematology ROS (+)   Anesthesia Other Findings Past Medical History: No date: Breast cyst     Comment:  s/p aspiration No date: Complication of anesthesia     Comment:  itchy face No date: Hypertension  Past Surgical History: No date: BREAST CYST ASPIRATION; Left No date: CESAREAN SECTION     Comment:  two 12/30/2013: PARTIAL HYSTERECTOMY 1974: TONSILLECTOMY  BMI    Body Mass Index: 27.44 kg/m      Reproductive/Obstetrics negative OB ROS                            Anesthesia Physical Anesthesia Plan  ASA: II  Anesthesia Plan: General   Post-op Pain Management:    Induction:   PONV Risk Score and Plan: Propofol infusion and TIVA  Airway Management Planned: Natural Airway and Nasal Cannula  Additional Equipment:   Intra-op Plan:   Post-operative Plan:   Informed Consent: I have reviewed the patients History and Physical, chart, labs and discussed the procedure including the risks, benefits and alternatives for the proposed anesthesia with the patient or authorized representative who has indicated his/her understanding and acceptance.     Dental Advisory Given  Plan Discussed with:  Anesthesiologist  Anesthesia Plan Comments:         Anesthesia Quick Evaluation

## 2019-06-16 NOTE — H&P (Signed)
Cephas Darby, MD [address]  [place]  [place], [place] [address]  Main: [phone]  Fax: [phone] Pager: [phone]  Primary Care Physician:  Einar Pheasant, MD Primary Gastroenterologist:  Dr. Cephas Darby  Pre-Procedure History & Physical: HPI:  Kelly Lowery is a 49 y.o. female is here for an colonoscopy.   Past Medical History:  Diagnosis Date  . Breast cyst    s/p aspiration  . Complication of anesthesia    itchy face  . Hypertension     Past Surgical History:  Procedure Laterality Date  . BREAST CYST ASPIRATION Left   . CESAREAN SECTION     two  . PARTIAL HYSTERECTOMY  12/30/2013  . TONSILLECTOMY  1974    Prior to Admission medications   Medication Sig Start Date End Date Taking? Authorizing Provider  traZODone (DESYREL) 50 MG tablet TAKE 1/2 TO 1 TABLET BY MOUTH AT BEDTIME AS NEEDED FOR SLEEP 04/11/19  Yes Einar Pheasant, MD  albuterol (PROVENTIL HFA;VENTOLIN HFA) 108 (90 Base) MCG/ACT inhaler Inhale 2 puffs into the lungs every 6 (six) hours as needed for wheezing or shortness of breath. Patient not taking: Reported on 06/16/2019 06/18/16   Delman Kitten, MD  aspirin EC 81 MG tablet Take 81 mg by mouth daily.    [provider]  Azelastine HCl (ASTEPRO) 0.15 % SOLN Place 2 sprays into the nose daily. 1x per day 06/01/19   Einar Pheasant, MD  buPROPion (WELLBUTRIN XL) 150 MG 24 hr tablet Take 1 tablet (150 mg total) by mouth daily. 02/15/19   Einar Pheasant, MD  Calcium Carbonate-Vit D-Min (CALCIUM 1200 PO) Take by mouth.    [provider]  cyanocobalamin 1000 MCG tablet Take 1,000 mcg by mouth daily.    [provider]  MELATONIN PO Take by mouth at bedtime.    [provider]  Multiple Vitamin (MULTIVITAMIN) capsule Take 1 capsule by mouth daily.    [provider]    Allergies as of 04/12/2019 - Review Complete 02/20/2019  Allergen Reaction Noted  . Doxycycline  06/18/2017  . Levaquin  [levofloxacin] Other (See Comments) 06/18/2016  . Keflex [cephalexin] Rash 01/09/2016    Family History  Problem Relation Age of Onset  . Breast cancer Maternal Grandmother 47       47 and 60  . Lung cancer Paternal Aunt     Social History   Socioeconomic History  . Marital status: Married    Spouse name: Not on file  . Number of children: Not on file  . Years of education: Not on file  . Highest education level: Not on file  Occupational History  . Not on file  Tobacco Use  . Smoking status: Former Smoker    Packs/day: 0.50    Years: 20.00    Pack years: 10.00    Types: Cigarettes  . Smokeless tobacco: Never Used  Substance and Sexual Activity  . Alcohol use: Yes    Comment: occasionally  . Drug use: No  . Sexual activity: Not on file  Other Topics Concern  . Not on file  Social History Narrative   3 dogs labradors   Social Determinants of Health   Financial Resource Strain:   . Difficulty of Paying Living Expenses:   Food Insecurity:   . Worried About Charity fundraiser in the Last Year:   . Arboriculturist in the Last Year:   Transportation Needs:   . Film/video editor (Medical):   Marland Kitchen  Lack of Transportation (Non-Medical):   Physical Activity:   . Days of Exercise per Week:   . Minutes of Exercise per Session:   Stress:   . Feeling of Stress :   Social Connections:   . Frequency of Communication with Friends and Family:   . Frequency of Social Gatherings with Friends and Family:   . Attends Religious Services:   . Active Member of Clubs or Organizations:   . Attends Archivist Meetings:   Marland Kitchen Marital Status:   Intimate Partner Violence:   . Fear of Current or Ex-Partner:   . Emotionally Abused:   Marland Kitchen Physically Abused:   . Sexually Abused:     Review of Systems: See HPI, otherwise negative ROS  Physical Exam: BP (!) 145/92   Pulse 75   Temp (!) 96.3 F (35.7 C) (Temporal)   Resp 16   Ht 5\' 6"  (1.676 m)   Wt 77.1 kg   SpO2 98%    BMI 27.44 kg/m  General:   Alert,  pleasant and cooperative in NAD Head:  Normocephalic and atraumatic. Neck:  Supple; no masses or thyromegaly. Lungs:  Clear throughout to auscultation.    Heart:  Regular rate and rhythm. Abdomen:  Soft, nontender and nondistended. Normal bowel sounds, without guarding, and without rebound.   Neurologic:  Alert and  oriented x4;  grossly normal neurologically.  Impression/Plan: Kelly Lowery is here for an colonoscopy to be performed for colon cancer screening  Risks, benefits, limitations, and alternatives regarding  colonoscopy have been reviewed with the patient.  Questions have been answered.  All parties agreeable.   Sherri Sear, MD  06/16/2019, 10:22 AM

## 2019-06-17 ENCOUNTER — Encounter: Payer: Self-pay | Admitting: Internal Medicine

## 2019-06-17 ENCOUNTER — Encounter: Payer: Self-pay | Admitting: *Deleted

## 2019-06-20 ENCOUNTER — Telehealth: Payer: Self-pay | Admitting: Internal Medicine

## 2019-06-20 NOTE — Telephone Encounter (Signed)
LMTCB

## 2019-06-20 NOTE — Telephone Encounter (Signed)
Pt called in and said her left wrist is really bothering her. She would like an x-ray to be done. I let her know that we didn't have any available appts and I tried calling [facility] but no one answered. Pt is wanting to know what Dr. Nicki Reaper suggests. I did let her know that she can always go to the Urgent Care if she can't wait on a response.

## 2019-06-28 NOTE — Telephone Encounter (Signed)
Seen at [facility] on 3/18

## 2019-07-04 ENCOUNTER — Emergency Department: Payer: 59

## 2019-07-04 ENCOUNTER — Inpatient Hospital Stay
Admission: EM | Admit: 2019-07-04 | Discharge: 2019-07-05 | DRG: 379 | Disposition: A | Discharge status: Home / Self Care | Payer: 59 | Attending: Internal Medicine | Admitting: Internal Medicine

## 2019-07-04 ENCOUNTER — Other Ambulatory Visit: Payer: Self-pay

## 2019-07-04 DIAGNOSIS — Z87891 Personal history of nicotine dependence: Secondary | ICD-10-CM

## 2019-07-04 DIAGNOSIS — Z90711 Acquired absence of uterus with remaining cervical stump: Secondary | ICD-10-CM

## 2019-07-04 DIAGNOSIS — G4700 Insomnia, unspecified: Secondary | ICD-10-CM | POA: Diagnosis present

## 2019-07-04 DIAGNOSIS — K264 Chronic or unspecified duodenal ulcer with hemorrhage: Principal | ICD-10-CM | POA: Diagnosis present

## 2019-07-04 DIAGNOSIS — Z881 Allergy status to other antibiotic agents status: Secondary | ICD-10-CM | POA: Diagnosis not present

## 2019-07-04 DIAGNOSIS — Z79899 Other long term (current) drug therapy: Secondary | ICD-10-CM | POA: Diagnosis not present

## 2019-07-04 DIAGNOSIS — K922 Gastrointestinal hemorrhage, unspecified: Secondary | ICD-10-CM | POA: Diagnosis present

## 2019-07-04 DIAGNOSIS — K269 Duodenal ulcer, unspecified as acute or chronic, without hemorrhage or perforation: Secondary | ICD-10-CM | POA: Diagnosis not present

## 2019-07-04 DIAGNOSIS — Z20822 Contact with and (suspected) exposure to covid-19: Secondary | ICD-10-CM | POA: Diagnosis present

## 2019-07-04 DIAGNOSIS — K921 Melena: Secondary | ICD-10-CM

## 2019-07-04 DIAGNOSIS — R195 Other fecal abnormalities: Secondary | ICD-10-CM | POA: Diagnosis present

## 2019-07-04 DIAGNOSIS — G5602 Carpal tunnel syndrome, left upper limb: Secondary | ICD-10-CM | POA: Diagnosis not present

## 2019-07-04 DIAGNOSIS — G5600 Carpal tunnel syndrome, unspecified upper limb: Secondary | ICD-10-CM | POA: Diagnosis present

## 2019-07-04 DIAGNOSIS — I10 Essential (primary) hypertension: Secondary | ICD-10-CM | POA: Diagnosis present

## 2019-07-04 LAB — COMPREHENSIVE METABOLIC PANEL
ALT: 24 U/L (ref 0–44)
AST: 20 U/L (ref 15–41)
Albumin: 4.5 g/dL (ref 3.5–5.0)
Alkaline Phosphatase: 79 U/L (ref 38–126)
Anion gap: 11 (ref 5–15)
BUN: 25 mg/dL — ABNORMAL HIGH (ref 6–20)
CO2: 25 mmol/L (ref 22–32)
Calcium: 9.7 mg/dL (ref 8.9–10.3)
Chloride: 101 mmol/L (ref 98–111)
Creatinine, Ser: 0.96 mg/dL (ref 0.44–1.00)
GFR calc Af Amer: >60 mL/min (ref >60)
GFR calc non Af Amer: >60 mL/min (ref >60)
Glucose, Bld: 139 mg/dL — ABNORMAL HIGH (ref 70–99)
Potassium: 4.2 mmol/L (ref 3.5–5.1)
Sodium: 137 mmol/L (ref 135–145)
Total Bilirubin: 0.6 mg/dL (ref 0.3–1.2)
Total Protein: 7.9 g/dL (ref 6.5–8.1)

## 2019-07-04 LAB — CBC
HCT: 45.7 % (ref 36.0–46.0)
Hemoglobin: 14.8 g/dL (ref 12.0–15.0)
MCH: 29.5 pg (ref 26.0–34.0)
MCHC: 32.4 g/dL (ref 30.0–36.0)
MCV: 91.2 fL (ref 80.0–100.0)
Platelets: 348 10*3/uL (ref 150–400)
RBC: 5.01 MIL/uL (ref 3.87–5.11)
RDW: 12.6 % (ref 11.5–15.5)
WBC: 10.7 10*3/uL — ABNORMAL HIGH (ref 4.0–10.5)
nRBC: 0 % (ref 0.0–0.2)

## 2019-07-04 LAB — TYPE AND SCREEN
ABO/RH(D): O POS
Antibody Screen: NEGATIVE

## 2019-07-04 LAB — HEMOGLOBIN
Hemoglobin: 12.6 g/dL (ref 12.0–15.0)
Hemoglobin: 12 g/dL (ref 12.0–15.0)

## 2019-07-04 LAB — RESPIRATORY PANEL BY RT PCR (FLU A&B, COVID)
Influenza A by PCR: NEGATIVE
Influenza B by PCR: NEGATIVE
SARS Coronavirus 2 by RT PCR: NEGATIVE

## 2019-07-04 LAB — HIV ANTIBODY (ROUTINE TESTING W REFLEX): HIV Screen 4th Generation wRfx: NONREACTIVE

## 2019-07-04 LAB — LIPASE, BLOOD: Lipase: 26 U/L (ref 11–51)

## 2019-07-04 MED ORDER — MORPHINE SULFATE (PF) 4 MG/ML IV SOLN
4.0000 mg | INTRAVENOUS | Status: DC | PRN
  Administered 2019-07-04: 4 mg via INTRAVENOUS
  Filled 2019-07-04: qty 1

## 2019-07-04 MED ORDER — MORPHINE SULFATE (PF) 2 MG/ML IV SOLN
2.0000 mg | INTRAVENOUS | Status: DC | PRN
  Administered 2019-07-04 – 2019-07-05 (×3): 2 mg via INTRAVENOUS
  Filled 2019-07-04 (×3): qty 1

## 2019-07-04 MED ORDER — PANTOPRAZOLE SODIUM 40 MG IV SOLR: 40.0000 mg | Freq: Two times a day (BID) | INTRAVENOUS | Status: DC

## 2019-07-04 MED ORDER — ACETAMINOPHEN 650 MG RE SUPP: 650.0000 mg | Freq: Four times a day (QID) | RECTAL | Status: DC | PRN

## 2019-07-04 MED ORDER — MELATONIN 5 MG PO TABS
5.0000 mg | ORAL_TABLET | Freq: Every day | ORAL | Status: DC
  Filled 2019-07-04 (×2): qty 1

## 2019-07-04 MED ORDER — SODIUM CHLORIDE 0.9 % IV SOLN
8.0000 mg/h | INTRAVENOUS | Status: DC
  Administered 2019-07-04 – 2019-07-05 (×3): 8 mg/h via INTRAVENOUS
  Filled 2019-07-04 (×3): qty 80

## 2019-07-04 MED ORDER — IOHEXOL 350 MG/ML SOLN
100.0000 mL | Freq: Once | INTRAVENOUS | Status: AC | PRN
  Administered 2019-07-04: 100 mL via INTRAVENOUS

## 2019-07-04 MED ORDER — ONDANSETRON HCL 4 MG/2ML IJ SOLN: 4.0000 mg | Freq: Four times a day (QID) | INTRAMUSCULAR | Status: DC | PRN

## 2019-07-04 MED ORDER — ACETAMINOPHEN 325 MG PO TABS: 650.0000 mg | ORAL_TABLET | Freq: Four times a day (QID) | ORAL | Status: DC | PRN

## 2019-07-04 MED ORDER — SODIUM CHLORIDE 0.9 % IV BOLUS
500.0000 mL | Freq: Once | INTRAVENOUS | Status: AC
  Administered 2019-07-04: 500 mL via INTRAVENOUS

## 2019-07-04 MED ORDER — ONDANSETRON HCL 4 MG PO TABS: 4.0000 mg | ORAL_TABLET | Freq: Four times a day (QID) | ORAL | Status: DC | PRN

## 2019-07-04 MED ORDER — ADULT MULTIVITAMIN W/MINERALS CH: 1.0000 | ORAL_TABLET | Freq: Every day | ORAL | Status: DC

## 2019-07-04 MED ORDER — TRAZODONE HCL 50 MG PO TABS
25.0000 mg | ORAL_TABLET | Freq: Every evening | ORAL | Status: DC | PRN
  Administered 2019-07-04: 50 mg via ORAL
  Filled 2019-07-04: qty 1

## 2019-07-04 MED ORDER — PANTOPRAZOLE SODIUM 40 MG IV SOLR
40.0000 mg | Freq: Once | INTRAVENOUS | Status: AC
  Administered 2019-07-04: 40 mg via INTRAVENOUS
  Filled 2019-07-04: qty 40

## 2019-07-04 MED ORDER — OXYCODONE HCL 5 MG PO TABS: 5.0000 mg | ORAL_TABLET | ORAL | Status: DC | PRN

## 2019-07-04 MED ORDER — PROMETHAZINE HCL 25 MG/ML IJ SOLN: 12.5000 mg | Freq: Four times a day (QID) | INTRAMUSCULAR | Status: DC | PRN

## 2019-07-04 MED ORDER — SODIUM CHLORIDE 0.9 % IV SOLN
INTRAVENOUS | Status: DC
  Administered 2019-07-05: 1000 mL via INTRAVENOUS

## 2019-07-04 MED ORDER — SODIUM CHLORIDE 0.9 % IV SOLN
80.0000 mg | Freq: Once | INTRAVENOUS | Status: DC
  Filled 2019-07-04: qty 80

## 2019-07-04 MED ORDER — AZELASTINE HCL 0.1 % NA SOLN
2.0000 | Freq: Every day | NASAL | Status: DC | PRN
  Filled 2019-07-04: qty 30

## 2019-07-04 NOTE — ED Notes (Signed)
Pt taken to xray 

## 2019-07-04 NOTE — ED Triage Notes (Signed)
Pt had carpal tunnel release on Friday, states she took 2 Doculax on Saturday due to constipation and had started having N/V/D late Saturday night and today is diaphoretic and has had 4 charcoal dark colored stools. Denies vomiting today but is very nauseous.Marland Kitchen

## 2019-07-04 NOTE — H&P (Signed)
History and Physical    Kelly Lowery [id number] DOB: 1971-01-20 DOA: 07/04/2019  PCP: Einar Pheasant, MD  Patient coming from: Home  I have personally briefly reviewed patient's old medical records in [place]  Chief Complaint: Nausea, vomiting, black stools  HPI: Kelly Lowery is a 49 y.o. female with medical history significant of insomnia/carpal tunnel syndrome status post recent surgery who presents for evaluation of melenic stools.  Patient states that she had the acute onset of severe abdominal pain 2 days prior to presentation with associated emesis and the passage of black tarry stools.  Of note the patient had a recent colonoscopy with [facility] gastroenterology that was normal.  The patient is on a daily aspirin however the exact indication for this is unclear.  The patient also takes ibuprofen however denies any excessive intake.  On presentation the patient's hemoglobin was 14.8.  She was hemodynamically stable.  Given the melenic stools gastroenterology was contacted from the emergency department.  The patient will be admitted to the hospitalist service with GI consulting.   ED Course: On presentation the patient was found to be hemodynamically stable.  Initial laboratory data overall reassuring.  Hemoglobin 14.8.  Vital signs within normal limits.  The ED physician started Protonix infusion.  CT angio abdomen pelvis negative.  Hospitalist contacted for admission.  GI on consult.  Review of Systems: As per HPI otherwise 10 point review of systems negative.    Past Medical History:  Diagnosis Date  . Breast cyst    s/p aspiration  . Complication of anesthesia    itchy face  . Hypertension     Past Surgical History:  Procedure Laterality Date  . BREAST CYST ASPIRATION Left   . CESAREAN SECTION     two  . COLONOSCOPY WITH PROPOFOL N/A 06/16/2019   Procedure: COLONOSCOPY WITH PROPOFOL;  Surgeon: Lin Landsman, MD;  Location: [facility] ENDOSCOPY;  Service:  Gastroenterology;  Laterality: N/A;  . PARTIAL HYSTERECTOMY  12/30/2013  . TONSILLECTOMY  1974     reports that she has quit smoking. Her smoking use included cigarettes. She has a 10.00 pack-year smoking history. She has never used smokeless tobacco. She reports current alcohol use. She reports that she does not use drugs.  Allergies  Allergen Reactions  . Doxycycline     Hives   . Levaquin [Levofloxacin] Other (See Comments)    Joint pain  . Keflex [Cephalexin] Rash    Family History  Problem Relation Age of Onset  . Breast cancer Maternal Grandmother 47       47 and 60  . Lung cancer Paternal Aunt      Prior to Admission medications   Medication Sig Start Date End Date Taking? Authorizing Provider  aspirin EC 81 MG tablet Take 81 mg by mouth daily.   Yes [provider]  Azelastine HCl (ASTEPRO) 0.15 % SOLN Place 2 sprays into the nose daily. 1x per day Patient taking differently: Place 2 sprays into the nose daily as needed. 1x per day 06/01/19  Yes Einar Pheasant, MD  ibuprofen (ADVIL) 600 MG tablet Take 600 mg by mouth in the morning, at noon, and at bedtime.   Yes [provider]  MELATONIN PO Take by mouth at bedtime.   Yes [provider]  Multiple Vitamin (MULTIVITAMIN) capsule Take 1 capsule by mouth daily.   Yes [provider]  ondansetron (ZOFRAN-ODT) 4 MG disintegrating tablet Take 4 mg by mouth 2 (two) times daily as needed for  nausea or vomiting.    Yes [provider]  oxyCODONE (OXY IR/ROXICODONE) 5 MG immediate release tablet Take 5 mg by mouth every 4 (four) hours as needed. 06/30/19  Yes [provider]  traZODone (DESYREL) 50 MG tablet TAKE 1/2 TO 1 TABLET BY MOUTH AT BEDTIME AS NEEDED FOR SLEEP 04/11/19  Yes Einar Pheasant, MD    Physical Exam: Vitals:   07/04/19 1130 07/04/19 1245 07/04/19 1330 07/04/19 1400  BP: 113/79  110/72 102/70  Pulse: 79 75 69 71  Resp: 10 15 17 15   Temp:      TempSrc:       SpO2: 100% 97% 100% 96%  Weight:      Height:        Constitutional: NAD, calm, comfortable Vitals:   07/04/19 1130 07/04/19 1245 07/04/19 1330 07/04/19 1400  BP: 113/79  110/72 102/70  Pulse: 79 75 69 71  Resp: 10 15 17 15   Temp:      TempSrc:      SpO2: 100% 97% 100% 96%  Weight:      Height:       Eyes: PERRL, lids and conjunctivae normal ENMT: Mucous membranes are moist. Posterior pharynx clear of any exudate or lesions.Normal dentition.  Neck: normal, supple, no masses, no thyromegaly Respiratory: clear to auscultation bilaterally, no wheezing, no crackles. Normal respiratory effort. No accessory muscle use.  Cardiovascular: Regular rate and rhythm, no murmurs / rubs / gallops. No extremity edema. 2+ pedal pulses. No carotid bruits.  Abdomen: no tenderness, no masses palpated. No hepatosplenomegaly. Bowel sounds positive.  Musculoskeletal: Left upper extremity in splint Skin: no rashes, lesions, ulcers. No induration Neurologic: CN 2-12 grossly intact. Sensation intact, DTR normal. Strength 5/5 in all 4.  Psychiatric: Normal judgment and insight. Alert and oriented x 3. Normal mood.   (Anything < 9 systems with 2 bullets each down codes to level 1) (If patient refuses exam can't bill higher level) (Make sure to document decubitus ulcers present on admission -- if possible -- and whether patient has chronic indwelling catheter at time of admission)  Labs on Admission: I have personally reviewed following labs and imaging studies  CBC: Recent Labs  Lab 07/04/19 0800  WBC 10.7*  HGB 14.8  HCT 45.7  MCV 91.2  PLT 0000000   Basic Metabolic Panel: Recent Labs  Lab 07/04/19 0800  NA 137  K 4.2  CL 101  CO2 25  GLUCOSE 139*  BUN 25*  CREATININE 0.96  CALCIUM 9.7   GFR: Estimated Creatinine Clearance: 73.5 mL/min (by C-G formula based on SCr of 0.96 mg/dL). Liver Function Tests: Recent Labs  Lab 07/04/19 0800  AST 20  ALT 24  ALKPHOS 79  BILITOT 0.6  PROT  7.9  ALBUMIN 4.5   Recent Labs  Lab 07/04/19 0800  LIPASE 26   No results for input(s): AMMONIA in the last 168 hours. Coagulation Profile: No results for input(s): INR, PROTIME in the last 168 hours. Cardiac Enzymes: No results for input(s): CKTOTAL, CKMB, CKMBINDEX, TROPONINI in the last 168 hours. BNP (last 3 results) No results for input(s): PROBNP in the last 8760 hours. HbA1C: No results for input(s): HGBA1C in the last 72 hours. CBG: No results for input(s): GLUCAP in the last 168 hours. Lipid Profile: No results for input(s): CHOL, HDL, LDLCALC, TRIG, CHOLHDL, LDLDIRECT in the last 72 hours. Thyroid Function Tests: No results for input(s): TSH, T4TOTAL, FREET4, T3FREE, THYROIDAB in the last 72 hours. Anemia Panel: No results  for input(s): VITAMINB12, FOLATE, FERRITIN, TIBC, IRON, RETICCTPCT in the last 72 hours. Urine analysis:    Component Value Date/Time   BILIRUBINUR negative 09/11/2016 1406   PROTEINUR negative 09/11/2016 1406   UROBILINOGEN 0.2 09/11/2016 1406   NITRITE negative 09/11/2016 1406   LEUKOCYTESUR Negative 09/11/2016 1406    Radiological Exams on Admission: DG Abdomen Acute W/Chest  Result Date: 07/04/2019 CLINICAL DATA:  Nausea, vomiting and diarrhea. EXAM: DG ABDOMEN ACUTE W/ 1V CHEST COMPARISON:  Chest x-ray 06/18/2016 FINDINGS: Artifact related to EKG leads is noted. The cardiac silhouette, mediastinal and hilar contours are normal. The lungs are clear. No pleural effusions. No worrisome pulmonary lesions. Two views of the abdomen demonstrate scattered air-fluid levels throughout the colon. No significant distension. There also scattered loops of small bowel with air but no distension. The soft tissue shadows are maintained. No worrisome calcifications. The bony structures are intact. IMPRESSION: 1. No acute cardiopulmonary findings. 2. Scattered air-fluid levels throughout the colon but no findings for obstruction or perforation. Electronically  Signed   By: Marijo Sanes M.D.   On: 07/04/2019 10:34   CT Angio Abd/Pel w/ and/or w/o  Result Date: 07/04/2019 CLINICAL DATA:  49 year old female with melena. Evaluate for GI bleeding EXAM: CTA ABDOMEN AND PELVIS WITHOUT AND WITH CONTRAST TECHNIQUE: Multidetector CT imaging of the abdomen and pelvis was performed using the standard protocol during bolus administration of intravenous contrast. Multiplanar reconstructed images and MIPs were obtained and reviewed to evaluate the vascular anatomy. CONTRAST:  176mL OMNIPAQUE IOHEXOL 350 MG/ML SOLN COMPARISON:  None. FINDINGS: VASCULAR Aorta: Scattered heterogeneous and partially calcified atherosclerotic plaque along the infrarenal abdominal aorta. No evidence of aneurysm, dissection or penetrating ulceration. Celiac: Patent without evidence of aneurysm, dissection, vasculitis or significant stenosis. SMA: Patent without evidence of aneurysm, dissection, vasculitis or significant stenosis. Renals: Multiple (3) left renal arteries. Solitary right renal artery. The renal arteries are patent with out evidence of stenosis, aneurysm, dissection or fibromuscular dysplasia. IMA: Patent without evidence of aneurysm, dissection, vasculitis or significant stenosis. Inflow: Patent without evidence of aneurysm, dissection, vasculitis or significant stenosis. Proximal Outflow: Bilateral common femoral and visualized portions of the superficial and profunda femoral arteries are patent without evidence of aneurysm, dissection, vasculitis or significant stenosis. Veins: No focal venous abnormality. Review of the MIP images confirms the above findings. NON-VASCULAR Lower chest: No acute abnormality. Hepatobiliary: No focal liver abnormality is seen. No gallstones, gallbladder wall thickening, or biliary dilatation. Pancreas: Unremarkable. No pancreatic ductal dilatation or surrounding inflammatory changes. Spleen: Normal in size without focal abnormality. Adrenals/Urinary Tract:  Adrenal glands are unremarkable. Kidneys are normal, without renal calculi, focal lesion, or hydronephrosis. Bladder is unremarkable. Stomach/Bowel: Stomach is within normal limits. Appendix appears normal. No evidence of bowel wall thickening, distention, or inflammatory changes. Lymphatic: No suspicious lymphadenopathy. Reproductive: Surgical changes of prior hysterectomy. No adnexal mass. Other: No abdominal wall hernia or abnormality. No abdominopelvic ascites. Musculoskeletal: No acute or significant osseous findings. IMPRESSION: VASCULAR 1. No acute bleeding or vascular abnormality. 2.  Aortic Atherosclerosis (ICD10-170.0). 3. Multiple left-sided renal arteries. NON-VASCULAR 1. No acute abnormality within the abdomen or pelvis. 2. Surgical changes of prior hysterectomy without evidence of adnexal mass. Electronically Signed   By: Jacqulynn Cadet M.D.   On: 07/04/2019 13:35    EKG: Not performed on admission  Assessment/Plan Active Problems:   GI bleed   Melenic stools Suspected upper GI bleed  Patient with acute onset abdominal pain associated with nausea vomiting and passage of black tarry  stools Suspect upper GI bleed in the setting of recent aspirin and NSAID use Gastroenterology on consult Plan: Admit inpatient Continue Protonix infusion Trend hemoglobin every 8 hours Transfuse as needed, goal hemoglobin greater than 7 Gastroenterology consulted Clear liquid diet today, n.p.o. after midnight in case scope is necessary  Carpal tunnel syndrome Avoid NSAID use  History of hypertension No medications BP controlled  Insomnia Continue home melatonin and trazodone    DVT prophylaxis: SCDs Code Status: Full Family Communication:Husband at bedside DK disposition Plan: Anticipate return to previous home environment Consults called: GI - [place] Admission status: inpatient   Sidney Ace MD [facility] Pager [phone]  If 7PM-7AM, please contact  night-coverage  07/04/2019, 2:32 PM

## 2019-07-04 NOTE — ED Notes (Signed)
Pt back from, CT

## 2019-07-04 NOTE — ED Provider Notes (Signed)
[facility] Emergency Department Provider Note    First MD Initiated Contact with Patient 07/04/19 [phone]     (approximate)  I have reviewed the triage vital signs and the nursing notes.   HISTORY  Chief Complaint GI Bleeding    HPI Kelly Lowery is a 49 y.o. female the below listed past medical history takes 81 mg aspirin presents to the ER for evaluation of nausea epigastric discomfort worsening symptoms of heartburn as well as several episodes of black tarry diarrhea.  Does have a history of heartburn.  Has been taking Tums over the past several days.  Has not been taking Pepto-Bismol.  Is not on iron.  Did have recent screening colonoscopy that was reportedly reassuring.  Also recently had left hand carpal tunnel surgery.  Has not taken baby aspirin since Friday.    Past Medical History:  Diagnosis Date  . Breast cyst    s/p aspiration  . Complication of anesthesia    itchy face  . Hypertension    Family History  Problem Relation Age of Onset  . Breast cancer Maternal Grandmother 47       47 and 60  . Lung cancer Paternal Aunt    Past Surgical History:  Procedure Laterality Date  . BREAST CYST ASPIRATION Left   . CESAREAN SECTION     two  . COLONOSCOPY WITH PROPOFOL N/A 06/16/2019   Procedure: COLONOSCOPY WITH PROPOFOL;  Surgeon: Lin Landsman, MD;  [facility] END[facility]COPY;  Service: Gastroenterology;  Laterality: N/A;  . PARTIAL HYSTERECTOMY  12/30/2013  . TONSILLECTOMY  1974   Patient Active Problem List   Diagnosis Date Noted  . Hyperglycemia 04/28/2019  . Carpal tunnel syndrome 02/20/2019  . GERD (gastroesophageal reflux disease) 02/20/2019  . Encounter for screening colonoscopy 02/20/2019  . Skin tag 07/07/2018  . Elbow tendinitis 07/07/2018  . Nasal sore 04/19/2018  . Diarrhea 04/19/2018  . Abnormal mammogram 04/19/2018  . Vitamin D deficiency 04/19/2018  . Libido, decreased 02/08/2017  . Lower abdominal pain 11/29/2016    . Sinusitis 06/12/2016  . Sleeping difficulties 02/14/2016  . Family history of breast cancer 01/21/2016  . Health care maintenance 01/21/2016  . Breast cyst, left 01/17/2016      Prior to Admission medications   Medication Sig Start Date End Date Taking? Authorizing Provider  aspirin EC 81 MG tablet Take 81 mg by mouth daily.   Yes [provider]  Azelastine HCl (ASTEPRO) 0.15 % SOLN Place 2 sprays into the nose daily. 1x per day Patient taking differently: Place 2 sprays into the nose daily as needed. 1x per day 06/01/19  Yes Einar Pheasant, MD  ibuprofen (ADVIL) 600 MG tablet Take 600 mg by mouth in the morning, at noon, and at bedtime.   Yes [provider]  MELATONIN PO Take by mouth at bedtime.   Yes [provider]  Multiple Vitamin (MULTIVITAMIN) capsule Take 1 capsule by mouth daily.   Yes [provider]  ondansetron (ZOFRAN-ODT) 4 MG disintegrating tablet Take 4 mg by mouth 2 (two) times daily as needed for nausea or vomiting.    Yes [provider]  oxyCODONE (OXY IR/ROXICODONE) 5 MG immediate release tablet Take 5 mg by mouth every 4 (four) hours as needed. 06/30/19  Yes [provider]  traZODone (DESYREL) 50 MG tablet TAKE 1/2 TO 1 TABLET BY MOUTH AT BEDTIME AS NEEDED FOR SLEEP 04/11/19  Yes Einar Pheasant, MD    Allergies Doxycycline, Levaquin [levofloxacin],  and Keflex [cephalexin]    Social History Social History   Tobacco Use  . Smoking status: Former Smoker    Packs/day: 0.50    Years: 20.00    Pack years: 10.00    Types: Cigarettes  . Smokeless tobacco: Never Used  Substance Use Topics  . Alcohol use: Yes    Comment: occasionally  . Drug use: No    Review of Systems Patient denies headaches, rhinorrhea, blurry vision, numbness, shortness of breath, chest pain, edema, cough, abdominal pain, nausea, vomiting, diarrhea, dysuria, fevers, rashes or hallucinations unless otherwise stated above in  HPI. ____________________________________________   PHYSICAL EXAM:  VITAL SIGNS: Vitals:   07/04/19 1245 07/04/19 1330  BP:  110/72  Pulse: 75 69  Resp: 15 17  Temp:    SpO2: 97% 100%    Constitutional: Alert and oriented.  Eyes: Conjunctivae are normal.  Head: Atraumatic. Nose: No congestion/rhinnorhea. Mouth/Throat: Mucous membranes are moist.   Neck: No stridor. Painless ROM.  Cardiovascular: Normal rate, regular rhythm. Grossly normal heart sounds.  Good peripheral circulation. Respiratory: Normal respiratory effort.  No retractions. Lungs CTAB. Gastrointestinal: Soft and nontender. No distention. No abdominal bruits. No CVA tenderness. Genitourinary: melenotic stool guaiac positive. Musculoskeletal: No lower extremity tenderness nor edema.  No joint effusions. Neurologic:  Normal speech and language. No gross focal neurologic deficits are appreciated. No facial droop Skin:  Skin is warm, dry and intact. No rash noted. Psychiatric: Mood and affect are normal. Speech and behavior are normal.  ____________________________________________   LABS (all labs ordered are listed, but only abnormal results are displayed)  Results for orders placed or performed during the hospital encounter of 07/04/19 (from the past 24 hour(s))  Comprehensive metabolic panel     Status: Abnormal   Collection Time: 07/04/19  8:00 AM  Result Value Ref Range   Sodium 137 135 - 145 mmol/L   Potassium 4.2 3.5 - 5.1 mmol/L   Chloride 101 98 - 111 mmol/L   CO2 25 22 - 32 mmol/L   Glucose, Bld 139 (H) 70 - 99 mg/dL   BUN 25 (H) 6 - 20 mg/dL   Creatinine, Ser 0.96 0.44 - 1.00 mg/dL   Calcium 9.7 8.9 - 10.3 mg/dL   Total Protein 7.9 6.5 - 8.1 g/dL   Albumin 4.5 3.5 - 5.0 g/dL   AST 20 15 - 41 U/L   ALT 24 0 - 44 U/L   Alkaline Phosphatase 79 38 - 126 U/L   Total Bilirubin 0.6 0.3 - 1.2 mg/dL   GFR calc non Af Amer >60 >60 mL/min   GFR calc Af Amer >60 >60 mL/min   Anion gap 11 5 - 15  CBC      Status: Abnormal   Collection Time: 07/04/19  8:00 AM  Result Value Ref Range   WBC 10.7 (H) 4.0 - 10.5 K/uL   RBC 5.01 3.87 - 5.11 MIL/uL   Hemoglobin 14.8 12.0 - 15.0 g/dL   HCT 45.7 36.0 - 46.0 %   MCV 91.2 80.0 - 100.0 fL   MCH 29.5 26.0 - 34.0 pg   MCHC 32.4 30.0 - 36.0 g/dL   RDW 12.6 11.5 - 15.5 %   Platelets 348 150 - 400 K/uL   nRBC 0.0 0.0 - 0.2 %  Type and screen [facility]     Status: None   Collection Time: 07/04/19  8:00 AM  Result Value Ref Range   ABO/RH(D) O POS    Antibody Screen  NEG    Sample Expiration      07/07/2019,2359 Performed at [facility], [address] [place], [place] [address]   Lipase, blood     Status: None   Collection Time: 07/04/19  8:00 AM  Result Value Ref Range   Lipase 26 11 - 51 U/L   ____________________________________________ ____________________________________________  RADIOLOGY  I personally reviewed all radiographic images ordered to evaluate for the above acute complaints and reviewed radiology reports and findings.  These findings were personally discussed with the patient.  Please see medical record for radiology report.  ____________________________________________   PROCEDURES  Procedure(s) performed:  Procedures    Critical Care performed: no ____________________________________________   INITIAL IMPRESSION / ASSESSMENT AND PLAN / ED COURSE  Pertinent labs & imaging results that were available during my care of the patient were reviewed by me and considered in my medical decision making (see chart for details).   DDX: Upper GI bleed, perforation, PUD, gastritis, esophagitis, SBO  Kelly Lowery is a 49 y.o. who presents to the ED with symptoms as described above.  History concerning for upper GI bleed.  Reports that she does have a history of ulcers.  Has been taking lots of Tums recently.  Does have some epigastric pain however.  Will perform rectal exam with nurse  chaperone.  Clinical Course as of Jul 04 1410  Mon Jul 04, 2019  1011 Patient does have guaiac positive stools.  Will give Protonix IV.   [PR]  1111 Given the patient's acute pain with bleeding with recent procedure will order CT imaging to exclude perforation or active hemorrhage.   [PR]  [id number] CT imaging is reassuring.  Will discuss with hospitalist.     [PR]    Clinical Course User Index [PR] Merlyn Lot, MD    The patient was evaluated in Emergency Department today for the symptoms described in the history of present illness. He/she was evaluated in the context of the global COVID-19 pandemic, which necessitated consideration that the patient might be at risk for infection with the SARS-CoV-2 virus that causes COVID-19. Institutional protocols and algorithms that pertain to the evaluation of patients at risk for COVID-19 are in a state of rapid change based on information released by regulatory bodies including the CDC and federal and state organizations. These policies and algorithms were followed during the patient's care in the ED.  As part of my medical decision making, I reviewed the following data within the [facility] notes reviewed and incorporated, Labs reviewed, notes from prior ED visits and  Controlled Substance Database   ____________________________________________   FINAL CLINICAL IMPRESSION(S) / ED DIAGNOSES  Final diagnoses:  Melena      NEW MEDICATIONS STARTED DURING THIS VISIT:  New Prescriptions   No medications on file     Note:  This document was prepared using Dragon voice recognition software and may include unintentional dictation errors.    Merlyn Lot, MD 07/04/19 [phone]

## 2019-07-04 NOTE — ED Notes (Signed)
Pt taken to CT.

## 2019-07-04 NOTE — Consult Note (Signed)
Lucilla Lame, MD [facility]  [address], [place], [place] [address] Phone: [phone] Fax : [phone]  Consultation  Referring Provider:     Dr. Priscella Mann Primary Care Physician:  Einar Pheasant, MD Primary Gastroenterologist:  Dr. Marius Ditch         Reason for Consultation:     Melena  Date of Admission:  07/04/2019 Date of Consultation:  07/04/2019         HPI:   Kelly Lowery is a 49 y.o. female who reports that she had a episode of diarrhea in the middle of the night and did not see her stools but then reports that she had another bowel movement in the morning and it was black in color and that concerned her.  The patient recently had surgery for carpal tunnel and had been alternating between Advil and Tylenol.  She states that she only took 2 doses and then stopped.  She is also on a daily aspirin. The patient reports that she is also been taking Tums recently for indigestion.  Since being admitted the patient has not had any further signs of any GI bleeding such as hematemesis or black stools.  She also denies any abdominal pain but does state that when this all was happening she had a lot of abdominal bloating.  Past Medical History:  Diagnosis Date  . Breast cyst    s/p aspiration  . Complication of anesthesia    itchy face  . Hypertension     Past Surgical History:  Procedure Laterality Date  . BREAST CYST ASPIRATION Left   . CESAREAN SECTION     two  . COLONOSCOPY WITH PROPOFOL N/A 06/16/2019   Procedure: COLONOSCOPY WITH PROPOFOL;  Surgeon: Lin Landsman, MD;  Location: [facility] ENDOSCOPY;  Service: Gastroenterology;  Laterality: N/A;  . PARTIAL HYSTERECTOMY  12/30/2013  . TONSILLECTOMY  1974    Prior to Admission medications   Medication Sig Start Date End Date Taking? Authorizing Provider  aspirin EC 81 MG tablet Take 81 mg by mouth daily.   Yes [provider]  Azelastine HCl (ASTEPRO) 0.15 % SOLN Place 2 sprays into the nose daily. 1x per  day Patient taking differently: Place 2 sprays into the nose daily as needed. 1x per day 06/01/19  Yes Einar Pheasant, MD  ibuprofen (ADVIL) 600 MG tablet Take 600 mg by mouth in the morning, at noon, and at bedtime.   Yes [provider]  MELATONIN PO Take by mouth at bedtime.   Yes [provider]  Multiple Vitamin (MULTIVITAMIN) capsule Take 1 capsule by mouth daily.   Yes [provider]  ondansetron (ZOFRAN-ODT) 4 MG disintegrating tablet Take 4 mg by mouth 2 (two) times daily as needed for nausea or vomiting.    Yes [provider]  oxyCODONE (OXY IR/ROXICODONE) 5 MG immediate release tablet Take 5 mg by mouth every 4 (four) hours as needed. 06/30/19  Yes [provider]  traZODone (DESYREL) 50 MG tablet TAKE 1/2 TO 1 TABLET BY MOUTH AT BEDTIME AS NEEDED FOR SLEEP 04/11/19  Yes Einar Pheasant, MD    Family History  Problem Relation Age of Onset  . Breast cancer Maternal Grandmother 47       47 and 60  . Lung cancer Paternal Aunt      Social History   Tobacco Use  . Smoking status: Former Smoker    Packs/day: 0.50    Years: 20.00    Pack years: 10.00  Types: Cigarettes  . Smokeless tobacco: Never Used  Substance Use Topics  . Alcohol use: Yes    Comment: occasionally  . Drug use: No    Allergies as of 07/04/2019 - Review Complete 07/04/2019  Allergen Reaction Noted  . Doxycycline  06/18/2017  . Levaquin [levofloxacin] Other (See Comments) 06/18/2016  . Keflex [cephalexin] Rash 01/09/2016    Review of Systems:    All systems reviewed and negative except where noted in HPI.   Physical Exam:  Vital signs in last 24 hours: Temp:  [97.9 F (36.6 C)] 97.9 F (36.6 C) (03/29 0753) Pulse Rate:  [69-107] 75 (03/29 1549) Resp:  [10-30] 12 (03/29 1549) BP: (102-126)/(70-94) 117/75 (03/29 1549) SpO2:  [96 %-100 %] 99 % (03/29 1549) Weight:  [77.1 kg] 77.1 kg (03/29 1004)   General:   Pleasant, cooperative in NAD Head:   Normocephalic and atraumatic. Eyes:   No icterus.   Conjunctiva pink. PERRLA. Ears:  Normal auditory acuity. Neck:  Supple; no masses or thyroidomegaly Lungs: Respirations even and unlabored. Lungs clear to auscultation bilaterally.   No wheezes, crackles, or rhonchi.  Heart:  Regular rate and rhythm;  Without murmur, clicks, rubs or gallops Abdomen:  Soft, nondistended, nontender. Normal bowel sounds. No appreciable masses or hepatomegaly.  No rebound or guarding.  Rectal:  Not performed. Msk:  Symmetrical without gross deformities.    Extremities:  Without edema, cyanosis or clubbing. Neurologic:  Alert and oriented x3;  grossly normal neurologically. Skin:  Intact without significant lesions or rashes. Cervical Nodes:  No significant cervical adenopathy. Psych:  Alert and cooperative. Normal affect.  LAB RESULTS: Recent Labs    07/04/19 0800 07/04/19 1507  WBC 10.7*  --   HGB 14.8 12.6  HCT 45.7  --   PLT 348  --    BMET Recent Labs    07/04/19 0800  NA 137  K 4.2  CL 101  CO2 25  GLUCOSE 139*  BUN 25*  CREATININE 0.96  CALCIUM 9.7   LFT Recent Labs    07/04/19 0800  PROT 7.9  ALBUMIN 4.5  AST 20  ALT 24  ALKPHOS 79  BILITOT 0.6   PT/INR No results for input(s): LABPROT, INR in the last 72 hours.  STUDIES: DG Abdomen Acute W/Chest  Result Date: 07/04/2019 CLINICAL DATA:  Nausea, vomiting and diarrhea. EXAM: DG ABDOMEN ACUTE W/ 1V CHEST COMPARISON:  Chest x-ray 06/18/2016 FINDINGS: Artifact related to EKG leads is noted. The cardiac silhouette, mediastinal and hilar contours are normal. The lungs are clear. No pleural effusions. No worrisome pulmonary lesions. Two views of the abdomen demonstrate scattered air-fluid levels throughout the colon. No significant distension. There also scattered loops of small bowel with air but no distension. The soft tissue shadows are maintained. No worrisome calcifications. The bony structures are intact. IMPRESSION: 1. No  acute cardiopulmonary findings. 2. Scattered air-fluid levels throughout the colon but no findings for obstruction or perforation. Electronically Signed   By: Marijo Sanes M.D.   On: 07/04/2019 10:34   CT Angio Abd/Pel w/ and/or w/o  Result Date: 07/04/2019 CLINICAL DATA:  49 year old female with melena. Evaluate for GI bleeding EXAM: CTA ABDOMEN AND PELVIS WITHOUT AND WITH CONTRAST TECHNIQUE: Multidetector CT imaging of the abdomen and pelvis was performed using the standard protocol during bolus administration of intravenous contrast. Multiplanar reconstructed images and MIPs were obtained and reviewed to evaluate the vascular anatomy. CONTRAST:  152mL OMNIPAQUE IOHEXOL 350 MG/ML SOLN COMPARISON:  None. FINDINGS: VASCULAR  Aorta: Scattered heterogeneous and partially calcified atherosclerotic plaque along the infrarenal abdominal aorta. No evidence of aneurysm, dissection or penetrating ulceration. Celiac: Patent without evidence of aneurysm, dissection, vasculitis or significant stenosis. SMA: Patent without evidence of aneurysm, dissection, vasculitis or significant stenosis. Renals: Multiple (3) left renal arteries. Solitary right renal artery. The renal arteries are patent with out evidence of stenosis, aneurysm, dissection or fibromuscular dysplasia. IMA: Patent without evidence of aneurysm, dissection, vasculitis or significant stenosis. Inflow: Patent without evidence of aneurysm, dissection, vasculitis or significant stenosis. Proximal Outflow: Bilateral common femoral and visualized portions of the superficial and profunda femoral arteries are patent without evidence of aneurysm, dissection, vasculitis or significant stenosis. Veins: No focal venous abnormality. Review of the MIP images confirms the above findings. NON-VASCULAR Lower chest: No acute abnormality. Hepatobiliary: No focal liver abnormality is seen. No gallstones, gallbladder wall thickening, or biliary dilatation. Pancreas: Unremarkable.  No pancreatic ductal dilatation or surrounding inflammatory changes. Spleen: Normal in size without focal abnormality. Adrenals/Urinary Tract: Adrenal glands are unremarkable. Kidneys are normal, without renal calculi, focal lesion, or hydronephrosis. Bladder is unremarkable. Stomach/Bowel: Stomach is within normal limits. Appendix appears normal. No evidence of bowel wall thickening, distention, or inflammatory changes. Lymphatic: No suspicious lymphadenopathy. Reproductive: Surgical changes of prior hysterectomy. No adnexal mass. Other: No abdominal wall hernia or abnormality. No abdominopelvic ascites. Musculoskeletal: No acute or significant osseous findings. IMPRESSION: VASCULAR 1. No acute bleeding or vascular abnormality. 2.  Aortic Atherosclerosis (ICD10-170.0). 3. Multiple left-sided renal arteries. NON-VASCULAR 1. No acute abnormality within the abdomen or pelvis. 2. Surgical changes of prior hysterectomy without evidence of adnexal mass. Electronically Signed   By: Jacqulynn Cadet M.D.   On: 07/04/2019 13:35      Impression / Plan:   Assessment: Active Problems:   GI bleed   Kelly Lowery is a 49 y.o. y/o female with with melena.  The patient's hemoglobin on admission was 14.8 and with hydration is down to 12.6.  The patient has had no further sign of any GI bleeding or melena since being admitted to the emergency room.  The patient's BUN is slightly high at 25 indicative of an upper GI bleed.  Plan:  The patient has already been started on a PPI and she will be started on a clear liquid diet and set up for a EGD for tomorrow to rule out peptic ulcer disease.  The patient had been on 800 mg of Aleve for her carpal tunnel syndrome in addition to a daily aspirin.  The patient has been explained the plan and agrees with it.  Thank you for involving me in the care of this patient.      LOS: 0 days   Lucilla Lame, MD  07/04/2019, 4:14 PM Pager [phone] 7am-5pm  Check AMION for 5pm  -7am coverage and on weekends   Note: This dictation was prepared with Dragon dictation along with smaller phrase technology. Any transcriptional errors that result from this process are unintentional.

## 2019-07-05 ENCOUNTER — Inpatient Hospital Stay: Payer: 59 | Admitting: Anesthesiology

## 2019-07-05 ENCOUNTER — Encounter: Payer: Self-pay | Admitting: Internal Medicine

## 2019-07-05 ENCOUNTER — Encounter
Admission: EM | Disposition: A | Discharge status: Home / Self Care | Payer: Self-pay | Source: Home / Self Care | Attending: Internal Medicine

## 2019-07-05 ENCOUNTER — Telehealth: Payer: Self-pay | Admitting: Internal Medicine

## 2019-07-05 DIAGNOSIS — K269 Duodenal ulcer, unspecified as acute or chronic, without hemorrhage or perforation: Secondary | ICD-10-CM

## 2019-07-05 HISTORY — PX: ESOPHAGOGASTRODUODENOSCOPY (EGD) WITH PROPOFOL: SHX5813

## 2019-07-05 LAB — BASIC METABOLIC PANEL
Anion gap: 7 (ref 5–15)
BUN: 14 mg/dL (ref 6–20)
CO2: 25 mmol/L (ref 22–32)
Calcium: 8.8 mg/dL — ABNORMAL LOW (ref 8.9–10.3)
Chloride: 108 mmol/L (ref 98–111)
Creatinine, Ser: 0.84 mg/dL (ref 0.44–1.00)
GFR calc Af Amer: >60 mL/min (ref >60)
GFR calc non Af Amer: >60 mL/min (ref >60)
Glucose, Bld: 109 mg/dL — ABNORMAL HIGH (ref 70–99)
Potassium: 4.1 mmol/L (ref 3.5–5.1)
Sodium: 140 mmol/L (ref 135–145)

## 2019-07-05 LAB — CBC
HCT: 35.3 % — ABNORMAL LOW (ref 36.0–46.0)
Hemoglobin: 11.6 g/dL — ABNORMAL LOW (ref 12.0–15.0)
MCH: 30.1 pg (ref 26.0–34.0)
MCHC: 32.9 g/dL (ref 30.0–36.0)
MCV: 91.7 fL (ref 80.0–100.0)
Platelets: 261 10*3/uL (ref 150–400)
RBC: 3.85 MIL/uL — ABNORMAL LOW (ref 3.87–5.11)
RDW: 12.7 % (ref 11.5–15.5)
WBC: 7.9 10*3/uL (ref 4.0–10.5)
nRBC: 0 % (ref 0.0–0.2)

## 2019-07-05 LAB — HEMOGLOBIN: Hemoglobin: 11.6 g/dL — ABNORMAL LOW (ref 12.0–15.0)

## 2019-07-05 LAB — PROTIME-INR
INR: 1 (ref 0.8–1.2)
Prothrombin Time: 13.2 seconds (ref 11.4–15.2)

## 2019-07-05 SURGERY — ESOPHAGOGASTRODUODENOSCOPY (EGD) WITH PROPOFOL
Anesthesia: General

## 2019-07-05 MED ORDER — FENTANYL CITRATE (PF) 100 MCG/2ML IJ SOLN
INTRAMUSCULAR | Status: AC
  Filled 2019-07-05: qty 2

## 2019-07-05 MED ORDER — PANTOPRAZOLE SODIUM 40 MG PO TBEC: 40.0000 mg | DELAYED_RELEASE_TABLET | Freq: Two times a day (BID) | ORAL | 0 refills | Status: DC

## 2019-07-05 MED ORDER — PROPOFOL 500 MG/50ML IV EMUL
INTRAVENOUS | Status: DC | PRN
  Administered 2019-07-05: 140 ug/kg/min via INTRAVENOUS

## 2019-07-05 MED ORDER — LIDOCAINE HCL (CARDIAC) PF 100 MG/5ML IV SOSY
PREFILLED_SYRINGE | INTRAVENOUS | Status: DC | PRN
  Administered 2019-07-05: 50 mg via INTRAVENOUS

## 2019-07-05 MED ORDER — PROPOFOL 10 MG/ML IV BOLUS
INTRAVENOUS | Status: DC | PRN
  Administered 2019-07-05: 50 mg via INTRAVENOUS
  Administered 2019-07-05: 20 mg via INTRAVENOUS

## 2019-07-05 NOTE — Discharge Summary (Signed)
Physician Discharge Summary  Kelly Lowery [id number] DOB: Sep 17, 1970 DOA: 07/04/2019  PCP: Kelly Pheasant, MD  Admit date: 07/04/2019 Discharge date: 07/05/2019  Admitted From: Home Disposition:  Home  Recommendations for Outpatient Follow-up:  1. Follow up with PCP in 1-2 weeks 2.   Home Health:NO Equipment/Devices:None Discharge Condition:Stable CODE STATUS:FULL Diet recommendation: Heart Healthy  Brief/Interim Summary: [id number] Setzeris a 49 y.o.femalewith medical history significant ofinsomnia/carpal tunnel syndrome status post recent surgery who presents for evaluation of melenic stools. Patient states that she had the acute onset of severe abdominal pain 2 days prior to presentation with associated emesis and the passage of black tarry stools. Of note the patient had a recent colonoscopy with [facility] gastroenterology that was normal. The patient is on a daily aspirin however the exact indication for this is unclear. The patient also takes ibuprofen however denies any excessive intake. On presentation the patient's hemoglobin was 14.8. She was hemodynamically stable. Given the melenic stools gastroenterology was contacted from the emergency department. The patient will be admitted to the hospitalist service with GI consulting.  3/30: Patient seen and examined.  Remained stable overnight.  Hemoglobin 11.6 this morning.  Stable no distress.  Abdominal pain improved.  No further black tarry stools.  EGD done today.  Nonbleeding ulcer noted.  No specimens taken.  Per GI recommendations patient will be discharged.  GI provided recommendations about PPI.  Will discharge on twice daily Protonix 40 mg total course of 6 weeks.  Patient is instructed to follow-up with outpatient PCP within 1 to 2 weeks.  She is instructed to discontinue all aspirin and NSAID use.   Discharge Diagnoses:  Active Problems:   GI bleed   Postbulbar duodenal ulcer  Melenic stools Suspected  upper GI bleed Patient with acute onset abdominal pain associated with nausea vomiting and passage of black tarry stools Suspect upper GI bleed in the setting of recent aspirin and NSAID use Gastroenterology on consult Status post EGD 07/05/2019 Nonbleeding ulcer noted Okay for discharge from GI standpoint Will discharge on twice daily PPI x6 weeks DC home aspirin DC NSAID use Patient educated on medication changes prior to discharge  Carpal tunnel syndrome Avoid NSAID use  History of hypertension No medications BP controlled  Insomnia Continue home melatonin and trazodone  Discharge Instructions  Discharge Instructions    Diet - low sodium heart healthy   Complete by: As directed    Increase activity slowly   Complete by: As directed      Allergies as of 07/05/2019      Reactions   Doxycycline    Hives    Levaquin [levofloxacin] Other (See Comments)   Joint pain   Keflex [cephalexin] Rash      Medication List    STOP taking these medications   aspirin EC 81 MG tablet   ibuprofen 600 MG tablet Commonly known as: ADVIL     TAKE these medications   Azelastine HCl 0.15 % Soln Commonly known as: Astepro Place 2 sprays into the nose daily. 1x per day What changed:   when to take this  reasons to take this   MELATONIN PO Take by mouth at bedtime.   multivitamin capsule Take 1 capsule by mouth daily.   ondansetron 4 MG disintegrating tablet Commonly known as: ZOFRAN-ODT Take 4 mg by mouth 2 (two) times daily as needed for nausea or vomiting.   oxyCODONE 5 MG immediate release tablet Commonly known as: Oxy IR/ROXICODONE Take 5 mg by mouth every 4 (four)  hours as needed.   pantoprazole 40 MG tablet Commonly known as: Protonix Take 1 tablet (40 mg total) by mouth 2 (two) times daily.   traZODone 50 MG tablet Commonly known as: DESYREL TAKE 1/2 TO 1 TABLET BY MOUTH AT BEDTIME AS NEEDED FOR SLEEP       Allergies  Allergen Reactions  .  Doxycycline     Hives   . Levaquin [Levofloxacin] Other (See Comments)    Joint pain  . Keflex [Cephalexin] Rash    Consultations:  GI   Procedures/Studies: DG Abdomen Acute W/Chest  Result Date: 07/04/2019 CLINICAL DATA:  Nausea, vomiting and diarrhea. EXAM: DG ABDOMEN ACUTE W/ 1V CHEST COMPARISON:  Chest x-ray 06/18/2016 FINDINGS: Artifact related to EKG leads is noted. The cardiac silhouette, mediastinal and hilar contours are normal. The lungs are clear. No pleural effusions. No worrisome pulmonary lesions. Two views of the abdomen demonstrate scattered air-fluid levels throughout the colon. No significant distension. There also scattered loops of small bowel with air but no distension. The soft tissue shadows are maintained. No worrisome calcifications. The bony structures are intact. IMPRESSION: 1. No acute cardiopulmonary findings. 2. Scattered air-fluid levels throughout the colon but no findings for obstruction or perforation. Electronically Signed   By: Marijo Sanes M.D.   On: 07/04/2019 10:34   CT Angio Abd/Pel w/ and/or w/o  Result Date: 07/04/2019 CLINICAL DATA:  49 year old female with melena. Evaluate for GI bleeding EXAM: CTA ABDOMEN AND PELVIS WITHOUT AND WITH CONTRAST TECHNIQUE: Multidetector CT imaging of the abdomen and pelvis was performed using the standard protocol during bolus administration of intravenous contrast. Multiplanar reconstructed images and MIPs were obtained and reviewed to evaluate the vascular anatomy. CONTRAST:  157mL OMNIPAQUE IOHEXOL 350 MG/ML SOLN COMPARISON:  None. FINDINGS: VASCULAR Aorta: Scattered heterogeneous and partially calcified atherosclerotic plaque along the infrarenal abdominal aorta. No evidence of aneurysm, dissection or penetrating ulceration. Celiac: Patent without evidence of aneurysm, dissection, vasculitis or significant stenosis. SMA: Patent without evidence of aneurysm, dissection, vasculitis or significant stenosis. Renals:  Multiple (3) left renal arteries. Solitary right renal artery. The renal arteries are patent with out evidence of stenosis, aneurysm, dissection or fibromuscular dysplasia. IMA: Patent without evidence of aneurysm, dissection, vasculitis or significant stenosis. Inflow: Patent without evidence of aneurysm, dissection, vasculitis or significant stenosis. Proximal Outflow: Bilateral common femoral and visualized portions of the superficial and profunda femoral arteries are patent without evidence of aneurysm, dissection, vasculitis or significant stenosis. Veins: No focal venous abnormality. Review of the MIP images confirms the above findings. NON-VASCULAR Lower chest: No acute abnormality. Hepatobiliary: No focal liver abnormality is seen. No gallstones, gallbladder wall thickening, or biliary dilatation. Pancreas: Unremarkable. No pancreatic ductal dilatation or surrounding inflammatory changes. Spleen: Normal in size without focal abnormality. Adrenals/Urinary Tract: Adrenal glands are unremarkable. Kidneys are normal, without renal calculi, focal lesion, or hydronephrosis. Bladder is unremarkable. Stomach/Bowel: Stomach is within normal limits. Appendix appears normal. No evidence of bowel wall thickening, distention, or inflammatory changes. Lymphatic: No suspicious lymphadenopathy. Reproductive: Surgical changes of prior hysterectomy. No adnexal mass. Other: No abdominal wall hernia or abnormality. No abdominopelvic ascites. Musculoskeletal: No acute or significant osseous findings. IMPRESSION: VASCULAR 1. No acute bleeding or vascular abnormality. 2.  Aortic Atherosclerosis (ICD10-170.0). 3. Multiple left-sided renal arteries. NON-VASCULAR 1. No acute abnormality within the abdomen or pelvis. 2. Surgical changes of prior hysterectomy without evidence of adnexal mass. Electronically Signed   By: Jacqulynn Cadet M.D.   On: 07/04/2019 13:35    (Echo, Carotid,  EGD, Colonoscopy, ERCP)    Subjective: Seen  examined on the day of discharge No complaints, feels well EGD, no active bleeding noted Stable for discharge home`  Discharge Exam: Vitals:   07/05/19 1235 07/05/19 1345  BP: 102/74 118/74  Pulse: 82 68  Resp: 16 18  Temp:  97.8 F (36.6 C)  SpO2: 97% 100%   Vitals:   07/05/19 1146 07/05/19 1225 07/05/19 1235 07/05/19 1345  BP: 110/71 101/72 102/74 118/74  Pulse: 64 95 82 68  Resp: 17 14 16 18   Temp: (!) 97 F (36.1 C) (!) 97 F (36.1 C)  97.8 F (36.6 C)  TempSrc: Temporal   Oral  SpO2: 100% 94% 97% 100%  Weight: 78.5 kg     Height: 5\' 6"  (1.676 m)       General: Pt is alert, awake, not in acute distress Cardiovascular: RRR, S1/S2 +, no rubs, no gallops Respiratory: CTA bilaterally, no wheezing, no rhonchi Abdominal: Soft, NT, ND, bowel sounds + Extremities: no edema, no cyanosis    The results of significant diagnostics from this hospitalization (including imaging, microbiology, ancillary and laboratory) are listed below for reference.     Microbiology: Recent Results (from the past 240 hour(s))  Respiratory Panel by RT PCR (Flu A&B, Covid) - Nasopharyngeal Swab     Status: None   Collection Time: 07/04/19  2:10 PM   Specimen: Nasopharyngeal Swab  Result Value Ref Range Status   SARS Coronavirus 2 by RT PCR NEGATIVE NEGATIVE Final    Comment: (NOTE) SARS-CoV-2 target nucleic acids are NOT DETECTED. The SARS-CoV-2 RNA is generally detectable in upper respiratoy specimens during the acute phase of infection. The lowest concentration of SARS-CoV-2 viral copies this assay can detect is 131 copies/mL. A negative result does not preclude SARS-Cov-2 infection and should not be used as the sole basis for treatment or other patient management decisions. A negative result may occur with  improper specimen collection/handling, submission of specimen other than nasopharyngeal swab, presence of viral mutation(s) within the areas targeted by this assay, and inadequate  number of viral copies (<131 copies/mL). A negative result must be combined with clinical observations, patient history, and epidemiological information. The expected result is Negative. Fact Sheet for Patients:  PinkCheek.be Fact Sheet for Healthcare Providers:  GravelBags.it This test is not yet ap proved or cleared by the [place] FDA and  has been authorized for detection and/or diagnosis of SARS-CoV-2 by FDA under an Emergency Use Authorization (EUA). This EUA will remain  in effect (meaning this test can be used) for the duration of the COVID-19 declaration under Section 564(b)(1) of the Act, 21 U.S.C. section 360bbb-3(b)(1), unless the authorization is terminated or revoked sooner.    Influenza A by PCR NEGATIVE NEGATIVE Final   Influenza B by PCR NEGATIVE NEGATIVE Final    Comment: (NOTE) The Xpert Xpress SARS-CoV-2/FLU/RSV assay is intended as an aid in  the diagnosis of influenza from Nasopharyngeal swab specimens and  should not be used as a sole basis for treatment. Nasal washings and  aspirates are unacceptable for Xpert Xpress SARS-CoV-2/FLU/RSV  testing. Fact Sheet for Patients: PinkCheek.be Fact Sheet for Healthcare Providers: GravelBags.it This test is not yet approved or cleared by the [place] FDA and  has been authorized for detection and/or diagnosis of SARS-CoV-2 by  FDA under an Emergency Use Authorization (EUA). This EUA will remain  in effect (meaning this test can be used) for the duration of the  Covid-19 declaration under Section  564(b)(1) of the Act, 21  U.S.C. section 360bbb-3(b)(1), unless the authorization is  terminated or revoked. Performed at [facility], [address] [place], [place] [address]      Labs: BNP (last 3 results) No results for input(s): BNP in the last 8760 hours. Basic Metabolic  Panel: Recent Labs  Lab 07/04/19 0800 07/05/19 0407  NA 137 140  K 4.2 4.1  CL 101 108  CO2 25 25  GLUCOSE 139* 109*  BUN 25* 14  CREATININE 0.96 0.84  CALCIUM 9.7 8.8*   Liver Function Tests: Recent Labs  Lab 07/04/19 0800  AST 20  ALT 24  ALKPHOS 79  BILITOT 0.6  PROT 7.9  ALBUMIN 4.5   Recent Labs  Lab 07/04/19 0800  LIPASE 26   No results for input(s): AMMONIA in the last 168 hours. CBC: Recent Labs  Lab 07/04/19 0800 07/04/19 1507 07/04/19 2219 07/05/19 0407 07/05/19 1358  WBC 10.7*  --   --  7.9  --   HGB 14.8 12.6 12.0 11.6* 11.6*  HCT 45.7  --   --  35.3*  --   MCV 91.2  --   --  91.7  --   PLT 348  --   --  261  --    Cardiac Enzymes: No results for input(s): CKTOTAL, CKMB, CKMBINDEX, TROPONINI in the last 168 hours. BNP: Invalid input(s): POCBNP CBG: No results for input(s): GLUCAP in the last 168 hours. D-Dimer No results for input(s): DDIMER in the last 72 hours. Hgb A1c No results for input(s): HGBA1C in the last 72 hours. Lipid Profile No results for input(s): CHOL, HDL, LDLCALC, TRIG, CHOLHDL, LDLDIRECT in the last 72 hours. Thyroid function studies No results for input(s): TSH, T4TOTAL, T3FREE, THYROIDAB in the last 72 hours.  Invalid input(s): FREET3 Anemia work up No results for input(s): VITAMINB12, FOLATE, FERRITIN, TIBC, IRON, RETICCTPCT in the last 72 hours. Urinalysis    Component Value Date/Time   BILIRUBINUR negative 09/11/2016 1406   PROTEINUR negative 09/11/2016 1406   UROBILINOGEN 0.2 09/11/2016 1406   NITRITE negative 09/11/2016 1406   LEUKOCYTESUR Negative 09/11/2016 1406   Sepsis Labs Invalid input(s): PROCALCITONIN,  WBC,  LACTICIDVEN Microbiology Recent Results (from the past 240 hour(s))  Respiratory Panel by RT PCR (Flu A&B, Covid) - Nasopharyngeal Swab     Status: None   Collection Time: 07/04/19  2:10 PM   Specimen: Nasopharyngeal Swab  Result Value Ref Range Status   SARS Coronavirus 2 by RT PCR  NEGATIVE NEGATIVE Final    Comment: (NOTE) SARS-CoV-2 target nucleic acids are NOT DETECTED. The SARS-CoV-2 RNA is generally detectable in upper respiratoy specimens during the acute phase of infection. The lowest concentration of SARS-CoV-2 viral copies this assay can detect is 131 copies/mL. A negative result does not preclude SARS-Cov-2 infection and should not be used as the sole basis for treatment or other patient management decisions. A negative result may occur with  improper specimen collection/handling, submission of specimen other than nasopharyngeal swab, presence of viral mutation(s) within the areas targeted by this assay, and inadequate number of viral copies (<131 copies/mL). A negative result must be combined with clinical observations, patient history, and epidemiological information. The expected result is Negative. Fact Sheet for Patients:  PinkCheek.be Fact Sheet for Healthcare Providers:  GravelBags.it This test is not yet ap proved or cleared by the [place] FDA and  has been authorized for detection and/or diagnosis of SARS-CoV-2 by FDA under an Emergency Use Authorization (  EUA). This EUA will remain  in effect (meaning this test can be used) for the duration of the COVID-19 declaration under Section 564(b)(1) of the Act, 21 U.S.C. section 360bbb-3(b)(1), unless the authorization is terminated or revoked sooner.    Influenza A by PCR NEGATIVE NEGATIVE Final   Influenza B by PCR NEGATIVE NEGATIVE Final    Comment: (NOTE) The Xpert Xpress SARS-CoV-2/FLU/RSV assay is intended as an aid in  the diagnosis of influenza from Nasopharyngeal swab specimens and  should not be used as a sole basis for treatment. Nasal washings and  aspirates are unacceptable for Xpert Xpress SARS-CoV-2/FLU/RSV  testing. Fact Sheet for Patients: PinkCheek.be Fact Sheet for Healthcare  Providers: GravelBags.it This test is not yet approved or cleared by the [place] FDA and  has been authorized for detection and/or diagnosis of SARS-CoV-2 by  FDA under an Emergency Use Authorization (EUA). This EUA will remain  in effect (meaning this test can be used) for the duration of the  Covid-19 declaration under Section 564(b)(1) of the Act, 21  U.S.C. section 360bbb-3(b)(1), unless the authorization is  terminated or revoked. Performed at [facility], [address], [place], [place] [address]      Time coordinating discharge: Over 30 minutes  SIGNED:   Sidney Ace, MD  [facility] 07/05/2019, 2:45 PM Pager   If 7PM-7AM, please contact night-coverage

## 2019-07-05 NOTE — Anesthesia Preprocedure Evaluation (Addendum)
Anesthesia Evaluation  Patient identified by MRN, date of birth, ID band Patient awake    Reviewed: Allergy & Precautions, H&P , NPO status , Patient's Chart, lab work & pertinent test results  History of Anesthesia Complications Negative for: history of anesthetic complications  Airway Mallampati: II  TM Distance: >3 FB Neck ROM: full    Dental  (+) Teeth Intact   Pulmonary neg shortness of breath, neg COPD, former smoker,           Cardiovascular hypertension, (-) angina(-) Past MI (-) dysrhythmias      Neuro/Psych negative neurological ROS  negative psych ROS   GI/Hepatic Neg liver ROS, GERD  ,melena   Endo/Other  negative endocrine ROS  Renal/GU negative Renal ROS  negative genitourinary   Musculoskeletal   Abdominal   Peds  Hematology negative hematology ROS (+)   Anesthesia Other Findings Past Medical History: No date: Breast cyst     Comment:  s/p aspiration No date: Complication of anesthesia     Comment:  itchy face No date: Hypertension  Past Surgical History: No date: BREAST CYST ASPIRATION; Left No date: CESAREAN SECTION     Comment:  two 12/30/2013: PARTIAL HYSTERECTOMY 1974: TONSILLECTOMY  BMI    Body Mass Index: 27.44 kg/m      Reproductive/Obstetrics negative OB ROS                            Anesthesia Physical  Anesthesia Plan  ASA: II  Anesthesia Plan: General   Post-op Pain Management:    Induction:   PONV Risk Score and Plan: Propofol infusion and TIVA  Airway Management Planned: Natural Airway and Nasal Cannula  Additional Equipment:   Intra-op Plan:   Post-operative Plan:   Informed Consent: I have reviewed the patients History and Physical, chart, labs and discussed the procedure including the risks, benefits and alternatives for the proposed anesthesia with the patient or authorized representative who has indicated his/her  understanding and acceptance.     Dental Advisory Given  Plan Discussed with: Anesthesiologist  Anesthesia Plan Comments:         Anesthesia Quick Evaluation

## 2019-07-05 NOTE — Progress Notes (Signed)
The patient had an EGD with non bleeding ulcers. The Hb is slightly down. These type of ulcers have a low risk of rebleeding. Treat with a PPI and stop NSAID's. May be discharged from a GI oint of view.

## 2019-07-05 NOTE — Progress Notes (Signed)
PROGRESS NOTE    Kelly Lowery  [id number] DOB: [birth year] DOA: 07/04/2019 PCP: Einar Pheasant, MD   Brief Narrative:  HPI: Kelly Lowery is a 49 y.o. female with medical history significant of insomnia/carpal tunnel syndrome status post recent surgery who presents for evaluation of melenic stools.  Patient states that she had the acute onset of severe abdominal pain 2 days prior to presentation with associated emesis and the passage of black tarry stools.  Of note the patient had a recent colonoscopy with [facility] gastroenterology that was normal.  The patient is on a daily aspirin however the exact indication for this is unclear.  The patient also takes ibuprofen however denies any excessive intake.  On presentation the patient's hemoglobin was 14.8.  She was hemodynamically stable.  Given the melenic stools gastroenterology was contacted from the emergency department.  The patient will be admitted to the hospitalist service with GI consulting.  3/30: Patient seen and examined.  Remained stable overnight.  Hemoglobin 11.6 this morning.  Stable no distress.  Abdominal pain improved.  No further black tarry stools.  EGD done today.  Nonbleeding ulcer noted.  No specimens taken.   Assessment & Plan:   Active Problems:   GI bleed   Postbulbar duodenal ulcer  Melenic stools Suspected upper GI bleed  Patient with acute onset abdominal pain associated with nausea vomiting and passage of black tarry stools Suspect upper GI bleed in the setting of recent aspirin and NSAID use Gastroenterology on consult Status post EGD 07/05/2019 Nonbleeding ulcer noted Plan: Advance regular diet Check a.m. hemoglobin Anticipate medical readiness for discharge on 07/06/2019 if hemoglobin stable and patient tolerating p.o. DC aspirin No NSAIDs  Carpal tunnel syndrome Avoid NSAID use  History of hypertension No medications BP controlled  Insomnia Continue home melatonin and trazodone   DVT  prophylaxis: SCDs Code Status: Full code Family Communication: Husband at bedside Disposition Plan: Anticipate return to previous home environment.  Anticipate discharge on 07/06/2019.  Patient can likely discharge home if hemoglobin stable patient tolerating p.o. without issue.  Consultants:   GI  Procedures:   EGD 07/05/2019  Antimicrobials:   None   Subjective: Seen and examined No acute overnight events No new complaints  Objective: Vitals:   07/05/19 0347 07/05/19 1146 07/05/19 1225 07/05/19 1235  BP: 100/71 110/71 101/72 102/74  Pulse: 63 64 95 82  Resp: 18 17 14 16   Temp: 98.2 F (36.8 C) (!) 97 F (36.1 C) (!) 97 F (36.1 C)   TempSrc:  Temporal    SpO2: 97% 100% 94% 97%  Weight:  78.5 kg    Height:  5\' 6"  (1.676 m)      Intake/Output Summary (Last 24 hours) at 07/05/2019 1340 Last data filed at 07/05/2019 0958 Gross per 24 hour  Intake 0 ml  Output 1200 ml  Net -1200 ml   Filed Weights   07/04/19 1004 07/05/19 1146  Weight: 77.1 kg 78.5 kg    Examination:  General exam: Appears calm and comfortable  Respiratory system: Clear to auscultation. Respiratory effort normal. Cardiovascular system: S1 & S2 heard, RRR. No JVD, murmurs, rubs, gallops or clicks. No pedal edema. Gastrointestinal system: Abdomen is nondistended, soft and nontender. No organomegaly or masses felt. Normal bowel sounds heard. Central nervous system: Alert and oriented. No focal neurological deficits. Extremities: Symmetric 5 x 5 power. Skin: No rashes, lesions or ulcers Psychiatry: Judgement and insight appear normal. Mood & affect appropriate.     Data Reviewed: I have  personally reviewed following labs and imaging studies  CBC: Recent Labs  Lab 07/04/19 0800 07/04/19 1507 07/04/19 2219 07/05/19 0407  WBC 10.7*  --   --  7.9  HGB 14.8 12.6 12.0 11.6*  HCT 45.7  --   --  35.3*  MCV 91.2  --   --  91.7  PLT 348  --   --  0000000   Basic Metabolic Panel: Recent Labs    Lab 07/04/19 0800 07/05/19 0407  NA 137 140  K 4.2 4.1  CL 101 108  CO2 25 25  GLUCOSE 139* 109*  BUN 25* 14  CREATININE 0.96 0.84  CALCIUM 9.7 8.8*   GFR: Estimated Creatinine Clearance: 84.7 mL/min (by C-G formula based on SCr of 0.84 mg/dL). Liver Function Tests: Recent Labs  Lab 07/04/19 0800  AST 20  ALT 24  ALKPHOS 79  BILITOT 0.6  PROT 7.9  ALBUMIN 4.5   Recent Labs  Lab 07/04/19 0800  LIPASE 26   No results for input(s): AMMONIA in the last 168 hours. Coagulation Profile: Recent Labs  Lab 07/05/19 0407  INR 1.0   Cardiac Enzymes: No results for input(s): CKTOTAL, CKMB, CKMBINDEX, TROPONINI in the last 168 hours. BNP (last 3 results) No results for input(s): PROBNP in the last 8760 hours. HbA1C: No results for input(s): HGBA1C in the last 72 hours. CBG: No results for input(s): GLUCAP in the last 168 hours. Lipid Profile: No results for input(s): CHOL, HDL, LDLCALC, TRIG, CHOLHDL, LDLDIRECT in the last 72 hours. Thyroid Function Tests: No results for input(s): TSH, T4TOTAL, FREET4, T3FREE, THYROIDAB in the last 72 hours. Anemia Panel: No results for input(s): VITAMINB12, FOLATE, FERRITIN, TIBC, IRON, RETICCTPCT in the last 72 hours. Sepsis Labs: No results for input(s): PROCALCITON, LATICACIDVEN in the last 168 hours.  Recent Results (from the past 240 hour(s))  Respiratory Panel by RT PCR (Flu A&B, Covid) - Nasopharyngeal Swab     Status: None   Collection Time: 07/04/19  2:10 PM   Specimen: Nasopharyngeal Swab  Result Value Ref Range Status   SARS Coronavirus 2 by RT PCR NEGATIVE NEGATIVE Final    Comment: (NOTE) SARS-CoV-2 target nucleic acids are NOT DETECTED. The SARS-CoV-2 RNA is generally detectable in upper respiratoy specimens during the acute phase of infection. The lowest concentration of SARS-CoV-2 viral copies this assay can detect is 131 copies/mL. A negative result does not preclude SARS-Cov-2 infection and should not be used  as the sole basis for treatment or other patient management decisions. A negative result may occur with  improper specimen collection/handling, submission of specimen other than nasopharyngeal swab, presence of viral mutation(s) within the areas targeted by this assay, and inadequate number of viral copies (<131 copies/mL). A negative result must be combined with clinical observations, patient history, and epidemiological information. The expected result is Negative. Fact Sheet for Patients:  PinkCheek.be Fact Sheet for Healthcare Providers:  GravelBags.it This test is not yet ap proved or cleared by the [place] FDA and  has been authorized for detection and/or diagnosis of SARS-CoV-2 by FDA under an Emergency Use Authorization (EUA). This EUA will remain  in effect (meaning this test can be used) for the duration of the COVID-19 declaration under Section 564(b)(1) of the Act, 21 U.S.C. section 360bbb-3(b)(1), unless the authorization is terminated or revoked sooner.    Influenza A by PCR NEGATIVE NEGATIVE Final   Influenza B by PCR NEGATIVE NEGATIVE Final    Comment: (NOTE) The Xpert Xpress SARS-CoV-2/FLU/RSV assay  is intended as an aid in  the diagnosis of influenza from Nasopharyngeal swab specimens and  should not be used as a sole basis for treatment. Nasal washings and  aspirates are unacceptable for Xpert Xpress SARS-CoV-2/FLU/RSV  testing. Fact Sheet for Patients: PinkCheek.be Fact Sheet for Healthcare Providers: GravelBags.it This test is not yet approved or cleared by the [place] FDA and  has been authorized for detection and/or diagnosis of SARS-CoV-2 by  FDA under an Emergency Use Authorization (EUA). This EUA will remain  in effect (meaning this test can be used) for the duration of the  Covid-19 declaration under Section 564(b)(1) of the Act,  21  U.S.C. section 360bbb-3(b)(1), unless the authorization is  terminated or revoked. Performed at [facility], [address], [place], [place] [address]          Radiology Studies: DG Abdomen Acute W/Chest  Result Date: 07/04/2019 CLINICAL DATA:  Nausea, vomiting and diarrhea. EXAM: DG ABDOMEN ACUTE W/ 1V CHEST COMPARISON:  Chest x-ray 06/18/2016 FINDINGS: Artifact related to EKG leads is noted. The cardiac silhouette, mediastinal and hilar contours are normal. The lungs are clear. No pleural effusions. No worrisome pulmonary lesions. Two views of the abdomen demonstrate scattered air-fluid levels throughout the colon. No significant distension. There also scattered loops of small bowel with air but no distension. The soft tissue shadows are maintained. No worrisome calcifications. The bony structures are intact. IMPRESSION: 1. No acute cardiopulmonary findings. 2. Scattered air-fluid levels throughout the colon but no findings for obstruction or perforation. Electronically Signed   By: Marijo Sanes M.D.   On: 07/04/2019 10:34   CT Angio Abd/Pel w/ and/or w/o  Result Date: 07/04/2019 CLINICAL DATA:  49 year old female with melena. Evaluate for GI bleeding EXAM: CTA ABDOMEN AND PELVIS WITHOUT AND WITH CONTRAST TECHNIQUE: Multidetector CT imaging of the abdomen and pelvis was performed using the standard protocol during bolus administration of intravenous contrast. Multiplanar reconstructed images and MIPs were obtained and reviewed to evaluate the vascular anatomy. CONTRAST:  141mL OMNIPAQUE IOHEXOL 350 MG/ML SOLN COMPARISON:  None. FINDINGS: VASCULAR Aorta: Scattered heterogeneous and partially calcified atherosclerotic plaque along the infrarenal abdominal aorta. No evidence of aneurysm, dissection or penetrating ulceration. Celiac: Patent without evidence of aneurysm, dissection, vasculitis or significant stenosis. SMA: Patent without evidence of aneurysm, dissection, vasculitis  or significant stenosis. Renals: Multiple (3) left renal arteries. Solitary right renal artery. The renal arteries are patent with out evidence of stenosis, aneurysm, dissection or fibromuscular dysplasia. IMA: Patent without evidence of aneurysm, dissection, vasculitis or significant stenosis. Inflow: Patent without evidence of aneurysm, dissection, vasculitis or significant stenosis. Proximal Outflow: Bilateral common femoral and visualized portions of the superficial and profunda femoral arteries are patent without evidence of aneurysm, dissection, vasculitis or significant stenosis. Veins: No focal venous abnormality. Review of the MIP images confirms the above findings. NON-VASCULAR Lower chest: No acute abnormality. Hepatobiliary: No focal liver abnormality is seen. No gallstones, gallbladder wall thickening, or biliary dilatation. Pancreas: Unremarkable. No pancreatic ductal dilatation or surrounding inflammatory changes. Spleen: Normal in size without focal abnormality. Adrenals/Urinary Tract: Adrenal glands are unremarkable. Kidneys are normal, without renal calculi, focal lesion, or hydronephrosis. Bladder is unremarkable. Stomach/Bowel: Stomach is within normal limits. Appendix appears normal. No evidence of bowel wall thickening, distention, or inflammatory changes. Lymphatic: No suspicious lymphadenopathy. Reproductive: Surgical changes of prior hysterectomy. No adnexal mass. Other: No abdominal wall hernia or abnormality. No abdominopelvic ascites. Musculoskeletal: No acute or significant osseous findings. IMPRESSION: VASCULAR 1. No acute  bleeding or vascular abnormality. 2.  Aortic Atherosclerosis (ICD10-170.0). 3. Multiple left-sided renal arteries. NON-VASCULAR 1. No acute abnormality within the abdomen or pelvis. 2. Surgical changes of prior hysterectomy without evidence of adnexal mass. Electronically Signed   By: Jacqulynn Cadet M.D.   On: 07/04/2019 13:35        Scheduled Meds: . [MAR  Hold] melatonin  5 mg Oral QHS  . [MAR Hold] multivitamin with minerals  1 tablet Oral Daily  . [MAR Hold] pantoprazole  40 mg Intravenous Q12H   Continuous Infusions: . sodium chloride 1,000 mL (07/05/19 1155)  . pantoprozole (PROTONIX) infusion 8 mg/hr (07/05/19 0957)     LOS: 1 day    Time spent: 35 minutes    Sidney Ace, MD [facility] Pager [phone]  If 7PM-7AM, please contact night-coverage 07/05/2019, 1:40 PM

## 2019-07-05 NOTE — Op Note (Signed)
[facility] Gastroenterology Patient Name: Kelly Lowery Procedure Date: 07/05/2019 12:02 PM MRN: [id number] Account #: [id number] Date of Birth: 1970/10/15 Admit Type: Inpatient Age: 49 Room: [facility] ENDO ROOM 4 Gender: Female Note Status: Finalized Procedure:             Upper GI endoscopy Indications:           Melena Providers:             Lucilla Lame MD, MD Medicines:             Propofol per Anesthesia Complications:         No immediate complications. Procedure:             Pre-Anesthesia Assessment:                        - Prior to the procedure, a History and Physical was                         performed, and patient medications and allergies were                         reviewed. The patient's tolerance of previous                         anesthesia was also reviewed. The risks and benefits                         of the procedure and the sedation options and risks                         were discussed with the patient. All questions were                         answered, and informed consent was obtained. Prior                         Anticoagulants: The patient has taken no previous                         anticoagulant or antiplatelet agents. ASA Grade                         Assessment: II - A patient with mild systemic disease.                         After reviewing the risks and benefits, the patient                         was deemed in satisfactory condition to undergo the                         procedure.                        After obtaining informed consent, the endoscope was                         passed under direct vision. Throughout the procedure,  the patient's blood pressure, pulse, and oxygen                         saturations were monitored continuously. The Endoscope                         was introduced through the mouth, and advanced to the                         second part of duodenum. The upper GI  endoscopy was                         accomplished without difficulty. The patient tolerated                         the procedure well. Findings:      The examined esophagus was normal.      The entire examined stomach was normal.      Two non-bleeding cratered duodenal ulcers with a clean ulcer base       (Forrest Class III) were found in the second portion of the duodenum. Impression:            - Normal esophagus.                        - Normal stomach.                        - Non-bleeding duodenal ulcers with a clean ulcer base                         (Forrest Class III).                        - No specimens collected. Recommendation:        - Return patient to hospital ward for ongoing care.                        - Resume regular diet.                        - No aspirin, ibuprofen, naproxen, or other                         non-steroidal anti-inflammatory drugs. Procedure Code(s):     --- Professional ---                        [phone], Esophagogastroduodenoscopy, flexible,                         transoral; diagnostic, including collection of                         specimen(s) by brushing or washing, when performed                         (separate procedure) Diagnosis Code(s):     --- Professional ---                        K92.1, Melena (includes  Hematochezia)                        K26.9, Duodenal ulcer, unspecified as acute or                         chronic, without hemorrhage or perforation CPT copyright 2019 American Medical Association. All rights reserved. The codes documented in this report are preliminary and upon coder review may  be revised to meet current compliance requirements. Lucilla Lame MD, MD 07/05/2019 12:23:00 PM This report has been signed electronically. Number of Addenda: 0 Note Initiated On: 07/05/2019 12:02 PM Estimated Blood Loss:  Estimated blood loss: none.      [employer]

## 2019-07-05 NOTE — Transfer of Care (Signed)
Immediate Anesthesia Transfer of Care Note  Patient: Kelly Lowery  Procedure(s) Performed: ESOPHAGOGASTRODUODENOSCOPY (EGD) WITH PROPOFOL (N/A )  Patient Location: PACU and Endoscopy Unit  Anesthesia Type:General  Level of Consciousness: awake, alert  and oriented  Airway & Oxygen Therapy: Patient Spontanous Breathing  Post-op Assessment: Report given to RN and Post -op Vital signs reviewed and stable  Post vital signs: Reviewed and stable  Last Vitals:  Vitals Value Taken Time  BP 101/62 07/05/19 1225  Temp 36.1 C 07/05/19 1225  Pulse 98 07/05/19 1226  Resp 14 07/05/19 1226  SpO2 95 % 07/05/19 1226  Vitals shown include unvalidated device data.  Last Pain:  Vitals:   07/05/19 1225  TempSrc:   PainSc: 0-No pain      Patients Stated Pain Goal: 0 [phone]  Complications: No apparent anesthesia complications

## 2019-07-05 NOTE — Plan of Care (Signed)
The patient has been discharged. IV removed. Education has been completed. Vitals stable upon discharged.  Problem: Education: Goal: Knowledge of General Education information will improve Description: Including pain rating scale, medication(s)/side effects and non-pharmacologic comfort measures Outcome: Completed/Met   Problem: Health Behavior/Discharge Planning: Goal: Ability to manage health-related needs will improve Outcome: Completed/Met   Problem: Clinical Measurements: Goal: Ability to maintain clinical measurements within normal limits will improve Outcome: Completed/Met Goal: Will remain free from infection Outcome: Completed/Met Goal: Diagnostic test results will improve Outcome: Completed/Met Goal: Respiratory complications will improve Outcome: Completed/Met Goal: Cardiovascular complication will be avoided Outcome: Completed/Met   Problem: Activity: Goal: Risk for activity intolerance will decrease Outcome: Completed/Met   Problem: Nutrition: Goal: Adequate nutrition will be maintained Outcome: Completed/Met   Problem: Coping: Goal: Level of anxiety will decrease Outcome: Completed/Met   Problem: Elimination: Goal: Will not experience complications related to bowel motility Outcome: Completed/Met Goal: Will not experience complications related to urinary retention Outcome: Completed/Met   Problem: Pain Managment: Goal: General experience of comfort will improve Outcome: Completed/Met   Problem: Safety: Goal: Ability to remain free from injury will improve Outcome: Completed/Met   Problem: Skin Integrity: Goal: Risk for impaired skin integrity will decrease Outcome: Completed/Met

## 2019-07-05 NOTE — Telephone Encounter (Signed)
Pt states that the Protonix is not covered by insurance and would like something else.

## 2019-07-06 ENCOUNTER — Encounter: Payer: Self-pay | Admitting: *Deleted

## 2019-07-06 NOTE — Anesthesia Postprocedure Evaluation (Signed)
Anesthesia Post Note  Patient: Kelly Lowery  Procedure(s) Performed: ESOPHAGOGASTRODUODENOSCOPY (EGD) WITH PROPOFOL (N/A )  Patient location during evaluation: PACU Anesthesia Type: General Level of consciousness: awake and alert Pain management: pain level controlled Vital Signs Assessment: post-procedure vital signs reviewed and stable Respiratory status: spontaneous breathing, nonlabored ventilation and respiratory function stable Cardiovascular status: blood pressure returned to baseline and stable Postop Assessment: no apparent nausea or vomiting Anesthetic complications: no     Last Vitals:  Vitals:   07/05/19 1235 07/05/19 1345  BP: 102/74 118/74  Pulse: 82 68  Resp: 16 18  Temp:  36.6 C  SpO2: 97% 100%    Last Pain:  Vitals:   07/05/19 1345  TempSrc: Oral  PainSc:                  Tera Mater

## 2019-07-06 NOTE — Telephone Encounter (Signed)
Left detailed msg for pt. This is prescribed by GI.

## 2019-07-07 ENCOUNTER — Telehealth: Payer: Self-pay

## 2019-07-07 NOTE — Telephone Encounter (Signed)
Transition Care Management Follow-up Telephone Call  Date of discharge and from where: 07/05/19 from [facility]  How have you been since you were released from the hospital? "I am trying not to do too much too soon, doing okay and just taking it easy.No BM since Saturday. Just started eating solid foods yesterday, good water intake." Denies ABD pain, nausea, vomiting and all other symptoms.  Any questions or concerns? Would like to get pneumonia vaccine at [facility]. Schedule shingle vaccine at a later date.   Items Reviewed:  Did the pt receive and understand the discharge instructions provided? Yes  Medications obtained and verified? Yes, add protonix 40 mg BID. Stop aspirin and NSAIDS. Take all scheduled medications as directed.  Any new allergies since your discharge? None  Dietary orders reviewed? Low sodium, heart healthy  Do you have support at home? Yes, husband and family  Functional Questionnaire: (I = Independent and D = Dependent) ADLs: I  Follow up appointments reviewed:   PCP Hospital f/u appt confirmed?  Scheduled to see Dr, Nicki Reaper 07/19/19 @ 2:30.  Are transportation arrangements needed? No  If their condition worsens, is the pt aware to call PCP or go to the Emergency Dept.? Yes  Was the patient provided with contact information for the PCP's office or ED? Yes  Was to pt encouraged to call back with questions or concerns? Yes

## 2019-07-15 ENCOUNTER — Encounter: Payer: Self-pay | Admitting: Internal Medicine

## 2019-07-19 ENCOUNTER — Other Ambulatory Visit: Payer: Self-pay

## 2019-07-19 ENCOUNTER — Ambulatory Visit: Payer: PRIVATE HEALTH INSURANCE | Admitting: Internal Medicine

## 2019-07-19 ENCOUNTER — Encounter: Payer: Self-pay | Admitting: Internal Medicine

## 2019-07-19 DIAGNOSIS — K219 Gastro-esophageal reflux disease without esophagitis: Secondary | ICD-10-CM

## 2019-07-19 DIAGNOSIS — K921 Melena: Secondary | ICD-10-CM | POA: Diagnosis not present

## 2019-07-19 DIAGNOSIS — G5602 Carpal tunnel syndrome, left upper limb: Secondary | ICD-10-CM

## 2019-07-19 DIAGNOSIS — D649 Anemia, unspecified: Secondary | ICD-10-CM | POA: Diagnosis not present

## 2019-07-19 NOTE — Progress Notes (Signed)
Patient ID: Kelly Lowery, female   DOB: 1971/03/08, 49 y.o.   MRN: [id number]   Subjective:    Patient ID: Kelly Lowery, female    DOB: 1971-01-30, 49 y.o.   MRN: [id number]  HPI This visit occurred during the SARS-CoV-2 public health emergency.  Safety protocols were in place, including screening questions prior to the visit, additional usage of staff PPE, and extensive cleaning of exam room while observing appropriate contact time as indicated for disinfecting solutions.  Patient here for hospital follow up.  Admitted 07/04/19 - 07/05/19 for melanotic stools.  GI evaluated and non bleeding ulcer noted.  Was discharged on twice daily protonix.  Since her discharge she has done relatively well.  No acid reflux reported.  No chest pain.  No abdominal pain.  Ate manicotti - last week - blew up - took [place].  Seems to have done well since.  Bowls stable.  No blood.  Off ibuprofen.  Some right hand/arm soreness - s/p IV.  discussed - warm compresses.  Overall feels better.  Eating.     Past Medical History:  Diagnosis Date  . Breast cyst    s/p aspiration  . Complication of anesthesia    itchy face   Past Surgical History:  Procedure Laterality Date  . BREAST CYST ASPIRATION Left   . CESAREAN SECTION     two  . COLONOSCOPY WITH PROPOFOL N/A 06/16/2019   Procedure: COLONOSCOPY WITH PROPOFOL;  Surgeon: Lin Landsman, MD;  Location: [facility] ENDOSCOPY;  Service: Gastroenterology;  Laterality: N/A;  . ESOPHAGOGASTRODUODENOSCOPY (EGD) WITH PROPOFOL N/A 07/05/2019   Procedure: ESOPHAGOGASTRODUODENOSCOPY (EGD) WITH PROPOFOL;  Surgeon: Lucilla Lame, MD;  Location: [facility] ENDOSCOPY;  Service: Endoscopy;  Laterality: N/A;  . PARTIAL HYSTERECTOMY  12/30/2013  . TONSILLECTOMY  1974   Family History  Problem Relation Age of Onset  . Breast cancer Maternal Grandmother 47       47 and 60  . Lung cancer Paternal Aunt    Social History   Socioeconomic History  . Marital status: Married   Spouse name: Not on file  . Number of children: Not on file  . Years of education: Not on file  . Highest education level: Not on file  Occupational History  . Not on file  Tobacco Use  . Smoking status: Former Smoker    Packs/day: 0.50    Years: 20.00    Pack years: 10.00    Types: Cigarettes    Quit date: 06/02/2018    Years since quitting: 1.1  . Smokeless tobacco: Never Used  Substance and Sexual Activity  . Alcohol use: Yes    Comment: occasionally  . Drug use: No  . Sexual activity: Yes  Other Topics Concern  . Not on file  Social History Narrative   3 dogs labradors   Social Determinants of Health   Financial Resource Strain:   . Difficulty of Paying Living Expenses:   Food Insecurity:   . Worried About Charity fundraiser in the Last Year:   . Arboriculturist in the Last Year:   Transportation Needs:   . Film/video editor (Medical):   Marland Kitchen Lack of Transportation (Non-Medical):   Physical Activity:   . Days of Exercise per Week:   . Minutes of Exercise per Session:   Stress:   . Feeling of Stress :   Social Connections:   . Frequency of Communication with Friends and Family:   . Frequency of Social Gatherings  with Friends and Family:   . Attends Religious Services:   . Active Member of Clubs or Organizations:   . Attends Archivist Meetings:   Marland Kitchen Marital Status:     Outpatient Encounter Medications as of 07/19/2019  Medication Sig  . Azelastine HCl (ASTEPRO) 0.15 % SOLN Place 2 sprays into the nose daily. 1x per day (Patient taking differently: Place 2 sprays into the nose daily as needed. 1x per day)  . MELATONIN PO Take by mouth at bedtime.  . Multiple Vitamin (MULTIVITAMIN) capsule Take 1 capsule by mouth daily.  . ondansetron (ZOFRAN-ODT) 4 MG disintegrating tablet Take 4 mg by mouth 2 (two) times daily as needed for nausea or vomiting.   . pantoprazole (PROTONIX) 40 MG tablet Take 1 tablet (40 mg total) by mouth 2 (two) times daily.  .  traZODone (DESYREL) 50 MG tablet TAKE 1/2 TO 1 TABLET BY MOUTH AT BEDTIME AS NEEDED FOR SLEEP  . [DISCONTINUED] oxyCODONE (OXY IR/ROXICODONE) 5 MG immediate release tablet Take 5 mg by mouth every 4 (four) hours as needed.   No facility-administered encounter medications on file as of 07/19/2019.    Review of Systems  Constitutional: Negative for appetite change and unexpected weight change.  HENT: Negative for congestion and sinus pressure.   Respiratory: Negative for cough, chest tightness and shortness of breath.   Cardiovascular: Negative for chest pain, palpitations and leg swelling.  Gastrointestinal: Negative for diarrhea, nausea and vomiting.       No abdominal pain currently.    Genitourinary: Negative for difficulty urinating and dysuria.  Musculoskeletal: Negative for joint swelling and myalgias.  Skin: Negative for color change and rash.  Neurological: Negative for dizziness, light-headedness and headaches.  Psychiatric/Behavioral: Negative for agitation and dysphoric mood.       Objective:    Physical Exam Vitals reviewed.  Constitutional:      General: She is not in acute distress.    Appearance: Normal appearance.  HENT:     Head: Normocephalic and atraumatic.     Right Ear: External ear normal.     Left Ear: External ear normal.  Eyes:     General: No scleral icterus.       Right eye: No discharge.        Left eye: No discharge.  Neck:     Thyroid: No thyromegaly.  Cardiovascular:     Rate and Rhythm: Normal rate and regular rhythm.  Pulmonary:     Effort: No respiratory distress.     Breath sounds: Normal breath sounds. No wheezing.  Abdominal:     General: Bowel sounds are normal.     Palpations: Abdomen is soft.     Tenderness: There is no abdominal tenderness.  Musculoskeletal:        General: No swelling or tenderness.     Cervical back: Neck supple. No tenderness.  Lymphadenopathy:     Cervical: No cervical adenopathy.  Skin:    Findings: No  erythema or rash.  Neurological:     Mental Status: She is alert.  Psychiatric:        Mood and Affect: Mood normal.        Behavior: Behavior normal.     BP 128/78   Pulse 74   Temp (!) 97.4 F (36.3 C)   Resp 16   Ht 5\' 6"  (1.676 m)   Wt 174 lb 12.8 oz (79.3 kg)   SpO2 98%   BMI 28.21 kg/m  Wt Readings from  Last 3 Encounters:  07/19/19 174 lb 12.8 oz (79.3 kg)  07/05/19 173 lb (78.5 kg)  06/16/19 170 lb (77.1 kg)     Lab Results  Component Value Date   WBC 7.3 07/19/2019   HGB 11.2 (L) 07/19/2019   HCT 33.4 (L) 07/19/2019   PLT 312.0 07/19/2019   GLUCOSE 109 (H) 07/05/2019   CHOL 248 (H) 04/28/2019   TRIG 125.0 04/28/2019   HDL 69.10 04/28/2019   LDLCALC 154 (H) 04/28/2019   ALT 24 07/04/2019   AST 20 07/04/2019   NA 140 07/05/2019   K 4.1 07/05/2019   CL 108 07/05/2019   CREATININE 0.84 07/05/2019   BUN 14 07/05/2019   CO2 25 07/05/2019   TSH 1.82 04/28/2019   INR 1.0 07/05/2019   HGBA1C 5.7 04/28/2019    DG Abdomen Acute W/Chest  Result Date: 07/04/2019 CLINICAL DATA:  Nausea, vomiting and diarrhea. EXAM: DG ABDOMEN ACUTE W/ 1V CHEST COMPARISON:  Chest x-ray 06/18/2016 FINDINGS: Artifact related to EKG leads is noted. The cardiac silhouette, mediastinal and hilar contours are normal. The lungs are clear. No pleural effusions. No worrisome pulmonary lesions. Two views of the abdomen demonstrate scattered air-fluid levels throughout the colon. No significant distension. There also scattered loops of small bowel with air but no distension. The soft tissue shadows are maintained. No worrisome calcifications. The bony structures are intact. IMPRESSION: 1. No acute cardiopulmonary findings. 2. Scattered air-fluid levels throughout the colon but no findings for obstruction or perforation. Electronically Signed   By: Marijo Sanes M.D.   On: 07/04/2019 10:34   CT Angio Abd/Pel w/ and/or w/o  Result Date: 07/04/2019 CLINICAL DATA:  49 year old female with melena.  Evaluate for GI bleeding EXAM: CTA ABDOMEN AND PELVIS WITHOUT AND WITH CONTRAST TECHNIQUE: Multidetector CT imaging of the abdomen and pelvis was performed using the standard protocol during bolus administration of intravenous contrast. Multiplanar reconstructed images and MIPs were obtained and reviewed to evaluate the vascular anatomy. CONTRAST:  155mL OMNIPAQUE IOHEXOL 350 MG/ML SOLN COMPARISON:  None. FINDINGS: VASCULAR Aorta: Scattered heterogeneous and partially calcified atherosclerotic plaque along the infrarenal abdominal aorta. No evidence of aneurysm, dissection or penetrating ulceration. Celiac: Patent without evidence of aneurysm, dissection, vasculitis or significant stenosis. SMA: Patent without evidence of aneurysm, dissection, vasculitis or significant stenosis. Renals: Multiple (3) left renal arteries. Solitary right renal artery. The renal arteries are patent with out evidence of stenosis, aneurysm, dissection or fibromuscular dysplasia. IMA: Patent without evidence of aneurysm, dissection, vasculitis or significant stenosis. Inflow: Patent without evidence of aneurysm, dissection, vasculitis or significant stenosis. Proximal Outflow: Bilateral common femoral and visualized portions of the superficial and profunda femoral arteries are patent without evidence of aneurysm, dissection, vasculitis or significant stenosis. Veins: No focal venous abnormality. Review of the MIP images confirms the above findings. NON-VASCULAR Lower chest: No acute abnormality. Hepatobiliary: No focal liver abnormality is seen. No gallstones, gallbladder wall thickening, or biliary dilatation. Pancreas: Unremarkable. No pancreatic ductal dilatation or surrounding inflammatory changes. Spleen: Normal in size without focal abnormality. Adrenals/Urinary Tract: Adrenal glands are unremarkable. Kidneys are normal, without renal calculi, focal lesion, or hydronephrosis. Bladder is unremarkable. Stomach/Bowel: Stomach is within  normal limits. Appendix appears normal. No evidence of bowel wall thickening, distention, or inflammatory changes. Lymphatic: No suspicious lymphadenopathy. Reproductive: Surgical changes of prior hysterectomy. No adnexal mass. Other: No abdominal wall hernia or abnormality. No abdominopelvic ascites. Musculoskeletal: No acute or significant osseous findings. IMPRESSION: VASCULAR 1. No acute bleeding or vascular abnormality. 2.  Aortic Atherosclerosis (ICD10-170.0). 3. Multiple left-sided renal arteries. NON-VASCULAR 1. No acute abnormality within the abdomen or pelvis. 2. Surgical changes of prior hysterectomy without evidence of adnexal mass. Electronically Signed   By: Jacqulynn Cadet M.D.   On: 07/04/2019 13:35       Assessment & Plan:   Problem List Items Addressed This Visit    Anemia    Recently admitted with GI bleed as outlined (melanotic stools).  GI evaluated and non bleeding ulcer noted.  On protonix bid now.  No further bleeding.  Recheck cbc.        Relevant Orders   CBC with Differential/Platelet (Completed)   IBC + Ferritin (Completed)   Carpal tunnel syndrome    Previous injection.  F/u with ortho if persistent problem.        GERD (gastroesophageal reflux disease)    On protonix bid.  Follow.        GI bleed    Recently admitted as outlined with melanotic stools.  EGD - ulcer.  On protonix bid.  No further bleeding.  Follow.  Recheck cbc today.            Einar Pheasant, MD

## 2019-07-20 LAB — IBC + FERRITIN
Ferritin: 45.5 ng/mL (ref 10.0–291.0)
Iron: 52 ug/dL (ref 42–145)
Saturation Ratios: 13.1 % — ABNORMAL LOW (ref 20.0–50.0)
Transferrin: 284 mg/dL (ref 212.0–360.0)

## 2019-07-20 LAB — CBC WITH DIFFERENTIAL/PLATELET
Basophils Absolute: 0.1 10*3/uL (ref 0.0–0.1)
Basophils Relative: 1.1 % (ref 0.0–3.0)
Eosinophils Absolute: 0.1 10*3/uL (ref 0.0–0.7)
Eosinophils Relative: 2 % (ref 0.0–5.0)
HCT: 33.4 % — ABNORMAL LOW (ref 36.0–46.0)
Hemoglobin: 11.2 g/dL — ABNORMAL LOW (ref 12.0–15.0)
Lymphocytes Relative: 32.8 % (ref 12.0–46.0)
Lymphs Abs: 2.4 10*3/uL (ref 0.7–4.0)
MCHC: 33.6 g/dL (ref 30.0–36.0)
MCV: 91.2 fl (ref 78.0–100.0)
Monocytes Absolute: 0.6 10*3/uL (ref 0.1–1.0)
Monocytes Relative: 8.4 % (ref 3.0–12.0)
Neutro Abs: 4.1 10*3/uL (ref 1.4–7.7)
Neutrophils Relative %: 55.7 % (ref 43.0–77.0)
Platelets: 312 10*3/uL (ref 150.0–400.0)
RBC: 3.66 Mil/uL — ABNORMAL LOW (ref 3.87–5.11)
RDW: 13.6 % (ref 11.5–15.5)
WBC: 7.3 10*3/uL (ref 4.0–10.5)

## 2019-07-21 ENCOUNTER — Other Ambulatory Visit: Payer: Self-pay | Admitting: Internal Medicine

## 2019-07-21 DIAGNOSIS — D649 Anemia, unspecified: Secondary | ICD-10-CM

## 2019-07-21 NOTE — Progress Notes (Signed)
Order placed for f/u cbc.   

## 2019-07-22 ENCOUNTER — Telehealth: Payer: Self-pay | Admitting: *Deleted

## 2019-07-22 NOTE — Telephone Encounter (Signed)
-----   Message from Einar Pheasant, MD sent at 07/22/2019  6:33 AM EDT ----- I am ok to take her daughter as a pt.

## 2019-07-22 NOTE — Telephone Encounter (Signed)
Pt daughter scheduled for a TOC to Dr. Nicki Reaper

## 2019-07-22 NOTE — Telephone Encounter (Signed)
Please schedule new patient appointment for patient daughter turning 3.

## 2019-07-24 ENCOUNTER — Encounter: Payer: Self-pay | Admitting: Internal Medicine

## 2019-07-24 NOTE — Assessment & Plan Note (Signed)
Recently admitted with GI bleed as outlined (melanotic stools).  GI evaluated and non bleeding ulcer noted.  On protonix bid now.  No further bleeding.  Recheck cbc.

## 2019-07-24 NOTE — Assessment & Plan Note (Signed)
Recently admitted as outlined with melanotic stools.  EGD - ulcer.  On protonix bid.  No further bleeding.  Follow.  Recheck cbc today.

## 2019-07-24 NOTE — Assessment & Plan Note (Signed)
On protonix bid.  Follow.  ?

## 2019-07-24 NOTE — Assessment & Plan Note (Signed)
Previous injection.  F/u with ortho if persistent problem.

## 2019-08-04 ENCOUNTER — Other Ambulatory Visit: Payer: PRIVATE HEALTH INSURANCE

## 2019-08-04 ENCOUNTER — Ambulatory Visit
Admission: EM | Admit: 2019-08-04 | Discharge: 2019-08-04 | Disposition: A | Discharge status: Home / Self Care | Payer: 59 | Attending: Emergency Medicine | Admitting: Emergency Medicine

## 2019-08-04 ENCOUNTER — Telehealth: Payer: Self-pay | Admitting: Internal Medicine

## 2019-08-04 ENCOUNTER — Other Ambulatory Visit: Payer: Self-pay

## 2019-08-04 ENCOUNTER — Encounter: Payer: Self-pay | Admitting: Emergency Medicine

## 2019-08-04 ENCOUNTER — Ambulatory Visit (INDEPENDENT_AMBULATORY_CARE_PROVIDER_SITE_OTHER): Payer: 59

## 2019-08-04 DIAGNOSIS — Z23 Encounter for immunization: Secondary | ICD-10-CM

## 2019-08-04 DIAGNOSIS — W57XXXA Bitten or stung by nonvenomous insect and other nonvenomous arthropods, initial encounter: Secondary | ICD-10-CM

## 2019-08-04 DIAGNOSIS — D649 Anemia, unspecified: Secondary | ICD-10-CM

## 2019-08-04 DIAGNOSIS — M795 Residual foreign body in soft tissue: Secondary | ICD-10-CM | POA: Diagnosis not present

## 2019-08-04 LAB — CBC WITH DIFFERENTIAL/PLATELET
Basophils Absolute: 0 10*3/uL (ref 0.0–0.1)
Basophils Relative: 0.7 % (ref 0.0–3.0)
Eosinophils Absolute: 0.1 10*3/uL (ref 0.0–0.7)
Eosinophils Relative: 2.1 % (ref 0.0–5.0)
HCT: 36.1 % (ref 36.0–46.0)
Hemoglobin: 12 g/dL (ref 12.0–15.0)
Lymphocytes Relative: 35.2 % (ref 12.0–46.0)
Lymphs Abs: 2.3 10*3/uL (ref 0.7–4.0)
MCHC: 33.2 g/dL (ref 30.0–36.0)
MCV: 91.1 fl (ref 78.0–100.0)
Monocytes Absolute: 0.6 10*3/uL (ref 0.1–1.0)
Monocytes Relative: 9.6 % (ref 3.0–12.0)
Neutro Abs: 3.4 10*3/uL (ref 1.4–7.7)
Neutrophils Relative %: 52.4 % (ref 43.0–77.0)
Platelets: 275 10*3/uL (ref 150.0–400.0)
RBC: 3.96 Mil/uL (ref 3.87–5.11)
RDW: 13.1 % (ref 11.5–15.5)
WBC: 6.4 10*3/uL (ref 4.0–10.5)

## 2019-08-04 MED ORDER — MUPIROCIN 2 % EX OINT: TOPICAL_OINTMENT | CUTANEOUS | 0 refills | Status: DC

## 2019-08-04 NOTE — Discharge Instructions (Addendum)
Keep your wound clean and dry.  Wash it gently twice a day with soap and water.  Apply an antibiotic cream twice a day.    Follow up with your PCP or return here if you have rash, fever, or signs of infection, (such as redness, pus-like drainage, warmth, fever, chills) or other concerning symptoms.

## 2019-08-04 NOTE — Progress Notes (Addendum)
Patient presented for Tdap injection to left deltoid, patient voiced no concerns nor showed any signs of distress during injection. Patient stated she steeped on rusty nail. PCP said to give TDAP and send to UC for tick head removal, since no appointments available here in clinic.Patient stated she would comply. Patient was also informed that her rx of Bactroban was ordered.  Reviewed.  rx for bactroban sent in to pharmacy.   Dr Nicki Reaper

## 2019-08-04 NOTE — ED Triage Notes (Signed)
Patient in today c/o a tick in her belly button for ~10 days. Patient has tried to get it out, but has been unsuccessful.

## 2019-08-04 NOTE — Addendum Note (Signed)
Addended by: Alisa Graff on: 08/04/2019 01:24 PM   Modules accepted: Orders

## 2019-08-04 NOTE — Telephone Encounter (Signed)
I spoke with Kelly Lowery & he will make patient aware at nurse visit.

## 2019-08-04 NOTE — Telephone Encounter (Signed)
I called patient & she was unsure when she had a tetanus shot last, but has stepped on a nail.  Patient stated that her foot did bleed & was sore. I have added her to [place] scheduled for tdap. Is this okay with you?

## 2019-08-04 NOTE — Telephone Encounter (Signed)
I am ok with pt getting tetanus. I know she is not sure of date when had shot, just confirm with her if she has had tetanus with pertussis (whooping cough) - Tdap.  If no, ok to get Tdap.  If has had previous tetanus shot with pertussis - then only needs tetanus booster.  If she is not aware of ever having tetanus with pertussis - ok to give Tdap.  Also, please confirm no infection and confirm that pt does not feel she needs to be seen.

## 2019-08-04 NOTE — ED Provider Notes (Signed)
[facility]au    CSN: [id number] Arrival date & time: 08/04/19  1127      History   Chief Complaint Chief Complaint  Patient presents with  . Tick Removal    HPI Kelly Lowery is a 49 y.o. female.   Patient presents with a tick embedded in her umbilicus x 10 days.  She attempted to get it out with tweezers but the head embedded under the skin.  She denies fever, chills, rash, abdominal pain, or other symptoms.  Tetanus given today at her PCP's office.     The history is provided by the patient.    Past Medical History:  Diagnosis Date  . Breast cyst    s/p aspiration  . Complication of anesthesia    itchy face    Patient Active Problem List   Diagnosis Date Noted  . Anemia 07/19/2019  . Postbulbar duodenal ulcer   . GI bleed 07/04/2019  . Melena   . Hyperglycemia 04/28/2019  . Carpal tunnel syndrome 02/20/2019  . GERD (gastroesophageal reflux disease) 02/20/2019  . Encounter for screening colonoscopy 02/20/2019  . Skin tag 07/07/2018  . Elbow tendinitis 07/07/2018  . Nasal sore 04/19/2018  . Diarrhea 04/19/2018  . Abnormal mammogram 04/19/2018  . Vitamin D deficiency 04/19/2018  . Libido, decreased 02/08/2017  . Lower abdominal pain 11/29/2016  . Sinusitis 06/12/2016  . Sleeping difficulties 02/14/2016  . Family history of breast cancer 01/21/2016  . Health care maintenance 01/21/2016  . Breast cyst, left 01/17/2016    Past Surgical History:  Procedure Laterality Date  . BREAST CYST ASPIRATION Left   . CESAREAN SECTION     two  . COLONOSCOPY WITH PROPOFOL N/A 06/16/2019   Procedure: COLONOSCOPY WITH PROPOFOL;  Surgeon: Lin Landsman, MD;  Location: [facility] ENDOSCOPY;  Service: Gastroenterology;  Laterality: N/A;  . ESOPHAGOGASTRODUODENOSCOPY (EGD) WITH PROPOFOL N/A 07/05/2019   Procedure: ESOPHAGOGASTRODUODENOSCOPY (EGD) WITH PROPOFOL;  Surgeon: Lucilla Lame, MD;  Location: [facility] ENDOSCOPY;  Service: Endoscopy;  Laterality: N/A;  .  PARTIAL HYSTERECTOMY  12/30/2013  . TONSILLECTOMY  1974    OB History    Gravida  4   Para  2   Term      Preterm      AB  2   Living        SAB  2   TAB      Ectopic      Multiple      Live Births           Obstetric Comments  1st Menstrual Cycle:  13  1st Pregnancy:  22          Home Medications    Prior to Admission medications   Medication Sig Start Date End Date Taking? Authorizing Provider  Azelastine HCl (ASTEPRO) 0.15 % SOLN Place 2 sprays into the nose daily. 1x per day Patient taking differently: Place 2 sprays into the nose daily as needed. 1x per day 06/01/19  Yes Einar Pheasant, MD  MELATONIN PO Take by mouth at bedtime.   Yes [provider]  Multiple Vitamin (MULTIVITAMIN) capsule Take 1 capsule by mouth daily.   Yes [provider]  pantoprazole (PROTONIX) 40 MG tablet Take 1 tablet (40 mg total) by mouth 2 (two) times daily. 07/05/19 08/16/19 Yes Sreenath, Sudheer B, MD  traZODone (DESYREL) 50 MG tablet TAKE 1/2 TO 1 TABLET BY MOUTH AT BEDTIME AS NEEDED FOR SLEEP 04/11/19  Yes Einar Pheasant, MD  ondansetron (ZOFRAN-ODT) 4  MG disintegrating tablet Take 4 mg by mouth 2 (two) times daily as needed for nausea or vomiting.     [provider]    Family History Family History  Problem Relation Age of Onset  . Breast cancer Maternal Grandmother 47       47 and 60  . COPD Mother   . Diabetes Mother        borderline  . Heart disease Father   . COPD Father   . Lung cancer Paternal Aunt     Social History Social History   Tobacco Use  . Smoking status: Former Smoker    Packs/day: 0.50    Years: 20.00    Pack years: 10.00    Types: Cigarettes    Quit date: 06/02/2018    Years since quitting: 1.1  . Smokeless tobacco: Never Used  Substance Use Topics  . Alcohol use: Yes    Comment: occasionally  . Drug use: Yes    Frequency: 2.0 times per week    Types: Marijuana     Allergies   Doxycycline, Levaquin  [levofloxacin], and Keflex [cephalexin]   Review of Systems Review of Systems  Constitutional: Negative for chills and fever.  HENT: Negative for ear pain and sore throat.   Eyes: Negative for pain and visual disturbance.  Respiratory: Negative for cough and shortness of breath.   Cardiovascular: Negative for chest pain and palpitations.  Gastrointestinal: Negative for abdominal pain and vomiting.  Genitourinary: Negative for dysuria and hematuria.  Musculoskeletal: Negative for arthralgias and back pain.  Skin: Positive for wound. Negative for color change and rash.  Neurological: Negative for dizziness, seizures, syncope, weakness and numbness.  All other systems reviewed and are negative.    Physical Exam Triage Vital Signs ED Triage Vitals  Enc Vitals Group     BP 08/04/19 1129 105/69     Pulse Rate 08/04/19 1129 69     Resp 08/04/19 1129 18     Temp 08/04/19 1129 97.9 F (36.6 C)     Temp Source 08/04/19 1129 Oral     SpO2 08/04/19 1129 97 %     Weight 08/04/19 1130 170 lb (77.1 kg)     Height 08/04/19 1130 5\' 6"  (1.676 m)     Head Circumference --      Peak Flow --      Pain Score 08/04/19 1130 0     Pain Loc --      Pain Edu? --      Excl. in [place]? --    No data found.  Updated Vital Signs BP 105/69 (BP Location: Left Arm)   Pulse 69   Temp 97.9 F (36.6 C) (Oral)   Resp 18   Ht 5\' 6"  (1.676 m)   Wt 170 lb (77.1 kg)   SpO2 97%   BMI 27.44 kg/m   Visual Acuity Right Eye Distance:   Left Eye Distance:   Bilateral Distance:    Right Eye Near:   Left Eye Near:    Bilateral Near:     Physical Exam Vitals and nursing note reviewed.  Constitutional:      General: She is not in acute distress.    Appearance: She is well-developed.  HENT:     Head: Normocephalic and atraumatic.     Mouth/Throat:     Mouth: Mucous membranes are moist.  Eyes:     Conjunctiva/sclera: Conjunctivae normal.  Cardiovascular:     Rate and Rhythm: Normal rate and regular  rhythm.     Heart sounds: No murmur.  Pulmonary:     Effort: Pulmonary effort is normal. No respiratory distress.     Breath sounds: Normal breath sounds.  Abdominal:     Palpations: Abdomen is soft.     Tenderness: There is no abdominal tenderness. There is no guarding or rebound.  Musculoskeletal:     Cervical back: Neck supple.  Skin:    General: Skin is warm and dry.     Comments: Black piece of tick visible embedded in umbilicus.  No erythema or rash.    Neurological:     General: No focal deficit present.     Mental Status: She is alert and oriented to person, place, and time.     Gait: Gait normal.  Psychiatric:        Mood and Affect: Mood normal.        Behavior: Behavior normal.      UC Treatments / Results  Labs (all labs ordered are listed, but only abnormal results are displayed) Labs Reviewed - No data to display  EKG   Radiology No results found.  Procedures Foreign Body Removal  Date/Time: 08/04/2019 12:35 PM Performed by: Sharion Balloon, NP Authorized by: Sharion Balloon, NP   Consent:    Consent obtained:  Verbal   Risks discussed:  Bleeding, infection and pain Location:    Location:  Trunk Anesthesia (see MAR for exact dosages):    Anesthesia method:  Local infiltration   Local anesthetic:  Lidocaine 1% w/o epi Procedure details:    Scalpel size:  2   Foreign bodies recovered:  1   Description:  Tick head Post-procedure details:    Neurovascular status: intact     Confirmation:  No additional foreign bodies on visualization   Skin closure:  None   Dressing:  Antibiotic ointment and non-adherent dressing   Patient tolerance of procedure:  Tolerated well, no immediate complications   (including critical care time)  Medications Ordered in UC Medications - No data to display  Initial Impression / Assessment and Plan / UC Course  I have reviewed the triage vital signs and the nursing notes.  Pertinent labs & imaging results that were  available during my care of the patient were reviewed by me and considered in my medical decision making (see chart for details).   Tick bite. Foreign body in soft tissue.  Tick head removed.  Wound care instructions and signs of infections discussed with patient.  Discussed signs of tick-borne illnesses.  Instructed patient to follow-up with her PCP or return here if she has rash, fever, signs of infection, or other concerning symptoms.  Patient agrees to plan of care.      Final Clinical Impressions(s) / UC Diagnoses   Final diagnoses:  Tick bite, initial encounter  Foreign body (FB) in soft tissue     Discharge Instructions     Keep your wound clean and dry.  Wash it gently twice a day with soap and water.  Apply an antibiotic cream twice a day.    Follow up with your PCP or return here if you have rash, fever, or signs of infection, (such as redness, pus-like drainage, warmth, fever, chills) or other concerning symptoms.         ED Prescriptions    None     PDMP not reviewed this encounter.   Sharion Balloon, NP 08/04/19 1239

## 2019-08-04 NOTE — Telephone Encounter (Signed)
Pt stepped on a nail last night and needs a tetanus shot. She has an appt this afternoon for lab work and wants to know if she can get that done while here. Please advise and if possible, put on nurse schedule.

## 2019-08-04 NOTE — Telephone Encounter (Signed)
I do not mind calling in the bactroban, but since we have people scheduled through lunch - if needs evaluation today - would recommend urgent care for tick removal.

## 2019-08-04 NOTE — Telephone Encounter (Signed)
Patient stated that she doesn't know what type of tetanus she has received in the past, but hasn't had one in years & years. I have scheduled her for tdap. She said that her foot does not appear to be infected & is just red. However this just happened last night around 7p. She said that she does have a tick head that is still stuck in her belly button for over a week. She said there is no redness & that it is just there where she can't reach it. She said that her last visit on 4/13 there was supposed be an antibiotic cream sent in for her that wasn't She would like that sent in. She has used some old she had on the tick head & around the area, but now is out.   Do you want her to be seen by UC across the street or work her in with you?

## 2019-08-05 ENCOUNTER — Encounter: Payer: Self-pay | Admitting: Internal Medicine

## 2019-08-14 ENCOUNTER — Encounter: Payer: Self-pay | Admitting: Internal Medicine

## 2019-08-15 ENCOUNTER — Other Ambulatory Visit: Payer: Self-pay

## 2019-08-15 MED ORDER — PANTOPRAZOLE SODIUM 40 MG PO TBEC: 40.0000 mg | DELAYED_RELEASE_TABLET | Freq: Two times a day (BID) | ORAL | 1 refills | Status: DC

## 2019-08-26 ENCOUNTER — Encounter: Payer: Self-pay | Admitting: Internal Medicine

## 2019-08-26 ENCOUNTER — Other Ambulatory Visit: Payer: Self-pay

## 2019-08-26 ENCOUNTER — Ambulatory Visit
Admission: RE | Admit: 2019-08-26 | Discharge: 2019-08-26 | Disposition: A | Discharge status: Home / Self Care | Payer: 59 | Source: Ambulatory Visit | Attending: Internal Medicine | Admitting: Internal Medicine

## 2019-08-26 ENCOUNTER — Telehealth (INDEPENDENT_AMBULATORY_CARE_PROVIDER_SITE_OTHER): Payer: 59 | Admitting: Internal Medicine

## 2019-08-26 ENCOUNTER — Ambulatory Visit
Admission: RE | Admit: 2019-08-26 | Discharge: 2019-08-26 | Disposition: A | Discharge status: Home / Self Care | Payer: 59 | Attending: Internal Medicine | Admitting: Internal Medicine

## 2019-08-26 DIAGNOSIS — R059 Cough, unspecified: Secondary | ICD-10-CM

## 2019-08-26 DIAGNOSIS — R05 Cough: Secondary | ICD-10-CM | POA: Diagnosis not present

## 2019-08-26 DIAGNOSIS — D649 Anemia, unspecified: Secondary | ICD-10-CM

## 2019-08-26 DIAGNOSIS — Z8616 Personal history of COVID-19: Secondary | ICD-10-CM | POA: Insufficient documentation

## 2019-08-26 DIAGNOSIS — R739 Hyperglycemia, unspecified: Secondary | ICD-10-CM

## 2019-08-26 DIAGNOSIS — R1030 Lower abdominal pain, unspecified: Secondary | ICD-10-CM

## 2019-08-26 DIAGNOSIS — K219 Gastro-esophageal reflux disease without esophagitis: Secondary | ICD-10-CM

## 2019-08-26 NOTE — Progress Notes (Signed)
Patient ID: Kelly Lowery, female   DOB: 07-Oct-1970, 49 y.o.   MRN: [id number].   Virtual Visit via video Note  This visit type was conducted due to national recommendations for restrictions regarding the COVID-19 pandemic (e.g. social distancing).  This format is felt to be most appropriate for this patient at this time.  All issues noted in this document were discussed and addressed.  No physical exam was performed (except for noted visual exam findings with Video Visits).   I connected with Kelly Lowery by a video enabled telemedicine application and verified that I am speaking with the correct person using two identifiers. Location patient: home Location provider: work Persons participating in the virtual visit: patient, provider  The limitations, risks, security and privacy concerns of performing an evaluation and management service by video and the availability of in person appointments have been discussed.  It has also been discussed with the patient that there may be a patient responsible charge related to this service. The patient has expressed understanding and has agreed to proceed.   Reason for visit: follow up apt  HPI: Follow up appt:  To follow up on recent hospitalization for GI bleed and recently diagnosed with covid.  I saw her for hospital follow up 07/19/19 - admitted for melanotic stools.  GI evaluated and non bleeding ulcer noted.  Discharged on protonix.  No further bleeding.  No acid reflux reported.  She toes report persistent RLQ pain.  Tender to touch.  Had CT during recent hospitalization.  No acute abnormality noted.  No urine or bowel change.  She also tested positive for covid 08/12/19.  Noted headache and fever 102.7.  Aching with decreased appetite.  Diarrhea - improved.  5/9 - last noted fever.  Was taking tylenol.  Feeling better, until this weekend.  Developed head congestion and cough.  No nausea or vomiting.  Bowels ok.  Some ear discomfort.  No sore throat.   Minimal nasal drainage.  No chest pain, but does report some chest congestion with cough.  No increased sob.  Some fatigue.     ROS: See pertinent positives and negatives per HPI.  Past Medical History:  Diagnosis Date  . Breast cyst    s/p aspiration  . Complication of anesthesia    itchy face    Past Surgical History:  Procedure Laterality Date  . BREAST CYST ASPIRATION Left   . CESAREAN SECTION     two  . COLONOSCOPY WITH PROPOFOL N/A 06/16/2019   Procedure: COLONOSCOPY WITH PROPOFOL;  Surgeon: Lin Landsman, MD;  Location: [facility] ENDOSCOPY;  Service: Gastroenterology;  Laterality: N/A;  . ESOPHAGOGASTRODUODENOSCOPY (EGD) WITH PROPOFOL N/A 07/05/2019   Procedure: ESOPHAGOGASTRODUODENOSCOPY (EGD) WITH PROPOFOL;  Surgeon: Lucilla Lame, MD;  Location: [facility] ENDOSCOPY;  Service: Endoscopy;  Laterality: N/A;  . PARTIAL HYSTERECTOMY  12/30/2013  . TONSILLECTOMY  1974    Family History  Problem Relation Age of Onset  . Breast cancer Maternal Grandmother 47       47 and 60  . COPD Mother   . Diabetes Mother        borderline  . Heart disease Father   . COPD Father   . Lung cancer Paternal Aunt     SOCIAL HX: reviewed.    Current Outpatient Medications:  .  albuterol (VENTOLIN HFA) 108 (90 Base) MCG/ACT inhaler, Inhale 2 puffs into the lungs every 6 (six) hours as needed for wheezing or shortness of breath., Disp: 18 g, Rfl: 0 .  Azelastine HCl (ASTEPRO) 0.15 % SOLN, Place 2 sprays into the nose daily. 1x per day (Patient taking differently: Place 2 sprays into the nose daily as needed. 1x per day), Disp: 30 mL, Rfl: 2 .  MELATONIN PO, Take by mouth at bedtime., Disp: , Rfl:  .  Multiple Vitamin (MULTIVITAMIN) capsule, Take 1 capsule by mouth daily., Disp: , Rfl:  .  mupirocin ointment (BACTROBAN) 2 %, Apply to affected area bid, Disp: 22 g, Rfl: 0 .  pantoprazole (PROTONIX) 40 MG tablet, Take 1 tablet (40 mg total) by mouth 2 (two) times daily., Disp: 180 tablet, Rfl: 1 .   traZODone (DESYREL) 50 MG tablet, TAKE 1/2 TO 1 TABLET BY MOUTH AT BEDTIME AS NEEDED FOR SLEEP, Disp: 90 tablet, Rfl: 1  EXAM:  GENERAL: alert, oriented, appears well and in no acute distress  HEENT: atraumatic, conjunttiva clear, no obvious abnormalities on inspection of external nose and ears  NECK: normal movements of the head and neck  LUNGS: on inspection no signs of respiratory distress, breathing rate appears normal, no obvious gross SOB, gasping or wheezing  CV: no obvious cyanosis  PSYCH/NEURO: pleasant and cooperative, no obvious depression or anxiety, speech and thought processing grossly intact  ASSESSMENT AND PLAN:  Discussed the following assessment and plan:  Lower abdominal pain Persistent right lower quadrant pain.  Tender to touch.  Recent admission for melena.  Work up as outlined - non bleeding ulcer.  CT no acute abnormality.  Will refer to gyn.    Hyperglycemia Low car diet.  Follow met b and a1c.   History of COVID-19 Tested positive 08/12/19.  No fever now.  No headache.  Does feel overall better, but developed increased cough and congestion this past weekend.  Taking mucinex.  No increased sob.  No chest pain.  Will check cxr.  Albuterol inhaler.  Saline nasal spray and nasacort nasal spray as directed.  Continue mucinex, monitoring dose.  Stay hydrated.  Follow.  Call with update.    GERD (gastroesophageal reflux disease) On protonix bid.  No upper symptoms reported.  Follow.    Cough Recent diagnosis of covid as outlined.  Check cxr.  Continue mucinex.  Saline nasal spray/nasacort as directed.  Follow. Albuterol inhaler.    Anemia Recently admitted with GI bleed a soutlined.  GI evaluated.  Non bleeding ulcer noted.  On protonix bid.  Follow cbc.  No further bleeding noted.     Orders Placed This Encounter  Procedures  . DG Chest 2 View    Standing Status:   Future    Number of Occurrences:   1    Standing Expiration Date:   08/25/2020    Order  Specific Question:   Reason for Exam (SYMPTOM  OR DIAGNOSIS REQUIRED)    Answer:   history of covid - persistent cough/congestion    Order Specific Question:   Is patient pregnant?    Answer:   No    Order Specific Question:   Preferred imaging location?    Answer:   [facility]    Order Specific Question:   Radiology Contrast Protocol - do NOT remove file path    Answer:   [email or web address]  . Ambulatory referral to Gynecology    Referral Priority:   Routine    Referral Type:   Consultation    Referral Reason:   Specialty Services Required    Requested Specialty:   Gynecology    Number of Visits Requested:   1  Meds ordered this encounter  Medications  . albuterol (VENTOLIN HFA) 108 (90 Base) MCG/ACT inhaler    Sig: Inhale 2 puffs into the lungs every 6 (six) hours as needed for wheezing or shortness of breath.    Dispense:  18 g    Refill:  0     I discussed the assessment and treatment plan with the patient. The patient was provided an opportunity to ask questions and all were answered. The patient agreed with the plan and demonstrated an understanding of the instructions.   The patient was advised to call back or seek an in-person evaluation if the symptoms worsen or if the condition fails to improve as anticipated.   Einar Pheasant, MD

## 2019-08-27 ENCOUNTER — Encounter: Payer: Self-pay | Admitting: Internal Medicine

## 2019-08-28 ENCOUNTER — Encounter: Payer: Self-pay | Admitting: Internal Medicine

## 2019-08-28 MED ORDER — ALBUTEROL SULFATE HFA 108 (90 BASE) MCG/ACT IN AERS: 2.0000 | INHALATION_SPRAY | Freq: Four times a day (QID) | RESPIRATORY_TRACT | 0 refills | Status: DC | PRN

## 2019-08-28 NOTE — Assessment & Plan Note (Signed)
Persistent right lower quadrant pain.  Tender to touch.  Recent admission for melena.  Work up as outlined - non bleeding ulcer.  CT no acute abnormality.  Will refer to gyn.

## 2019-08-28 NOTE — Assessment & Plan Note (Signed)
Tested positive 08/12/19.  No fever now.  No headache.  Does feel overall better, but developed increased cough and congestion this past weekend.  Taking mucinex.  No increased sob.  No chest pain.  Will check cxr.  Albuterol inhaler.  Saline nasal spray and nasacort nasal spray as directed.  Continue mucinex, monitoring dose.  Stay hydrated.  Follow.  Call with update.

## 2019-08-28 NOTE — Assessment & Plan Note (Signed)
On protonix bid.  No upper symptoms reported.  Follow.

## 2019-08-28 NOTE — Assessment & Plan Note (Addendum)
Recent diagnosis of covid as outlined.  Check cxr.  Continue mucinex.  Saline nasal spray/nasacort as directed.  Follow. Albuterol inhaler.

## 2019-08-28 NOTE — Assessment & Plan Note (Signed)
Recently admitted with GI bleed a soutlined.  GI evaluated.  Non bleeding ulcer noted.  On protonix bid.  Follow cbc.  No further bleeding noted.

## 2019-08-28 NOTE — Assessment & Plan Note (Signed)
Low car diet.  Follow met b and a1c.

## 2019-09-19 ENCOUNTER — Other Ambulatory Visit: Payer: Self-pay | Admitting: Internal Medicine

## 2019-09-30 ENCOUNTER — Ambulatory Visit: Payer: PRIVATE HEALTH INSURANCE | Admitting: Internal Medicine

## 2019-09-30 ENCOUNTER — Other Ambulatory Visit: Payer: Self-pay

## 2019-09-30 ENCOUNTER — Encounter: Payer: Self-pay | Admitting: Internal Medicine

## 2019-09-30 DIAGNOSIS — G479 Sleep disorder, unspecified: Secondary | ICD-10-CM | POA: Diagnosis not present

## 2019-09-30 DIAGNOSIS — R739 Hyperglycemia, unspecified: Secondary | ICD-10-CM

## 2019-09-30 DIAGNOSIS — E559 Vitamin D deficiency, unspecified: Secondary | ICD-10-CM

## 2019-09-30 DIAGNOSIS — K219 Gastro-esophageal reflux disease without esophagitis: Secondary | ICD-10-CM

## 2019-09-30 DIAGNOSIS — R1030 Lower abdominal pain, unspecified: Secondary | ICD-10-CM

## 2019-09-30 DIAGNOSIS — D649 Anemia, unspecified: Secondary | ICD-10-CM

## 2019-09-30 NOTE — Progress Notes (Signed)
Patient ID: Kelly Lowery, female   DOB: [birth year], 49 y.o.   MRN: [id number]   Subjective:    Patient ID: Kelly Lowery, female    DOB: [birth year], 49 y.o.   MRN: [id number]  HPI This visit occurred during the SARS-CoV-2 public health emergency.  Safety protocols were in place, including screening questions prior to the visit, additional usage of staff PPE, and extensive cleaning of exam room while observing appropriate contact time as indicated for disinfecting solutions.  Patient here for a scheduled follow up.  States she is doing well.  Tries to say active. No chest pain or sob reported.   Not smoking.  Has gained some weight.  Discussed diet and exercise.  Right lower quadrant pain.  Persistent intermittent issue for her.  Discussed today.  Plans to f/u with gyn once Dr Leonides Schanz returns.  Blood pressure doing well.     Past Medical History:  Diagnosis Date  . Breast cyst    s/p aspiration  . Complication of anesthesia    itchy face   Past Surgical History:  Procedure Laterality Date  . BREAST CYST ASPIRATION Left   . CESAREAN SECTION     two  . COLONOSCOPY WITH PROPOFOL N/A 06/16/2019   Procedure: COLONOSCOPY WITH PROPOFOL;  Surgeon: Lin Landsman, MD;  Location: [facility] ENDOSCOPY;  Service: Gastroenterology;  Laterality: N/A;  . ESOPHAGOGASTRODUODENOSCOPY (EGD) WITH PROPOFOL N/A 07/05/2019   Procedure: ESOPHAGOGASTRODUODENOSCOPY (EGD) WITH PROPOFOL;  Surgeon: Lucilla Lame, MD;  Location: [facility] ENDOSCOPY;  Service: Endoscopy;  Laterality: N/A;  . PARTIAL HYSTERECTOMY  12/30/2013  . TONSILLECTOMY  1974   Family History  Problem Relation Age of Onset  . Breast cancer Maternal Grandmother 47       47 and 60  . COPD Mother   . Diabetes Mother        borderline  . Heart disease Father   . COPD Father   . Lung cancer Paternal Aunt    Social History   Socioeconomic History  . Marital status: Married    Spouse name: Not on file  . Number of children: Not on file  .  Years of education: Not on file  . Highest education level: Not on file  Occupational History  . Not on file  Tobacco Use  . Smoking status: Former Smoker    Packs/day: 0.50    Years: 20.00    Pack years: 10.00    Types: Cigarettes    Quit date: 06/02/2018    Years since quitting: 1.3  . Smokeless tobacco: Never Used  Vaping Use  . Vaping Use: Never used  Substance and Sexual Activity  . Alcohol use: Yes    Comment: occasionally  . Drug use: Yes    Frequency: 2.0 times per week    Types: Marijuana  . Sexual activity: Yes  Other Topics Concern  . Not on file  Social History Narrative   3 dogs labradors   Social Determinants of Health   Financial Resource Strain:   . Difficulty of Paying Living Expenses:   Food Insecurity:   . Worried About Charity fundraiser in the Last Year:   . Arboriculturist in the Last Year:   Transportation Needs:   . Film/video editor (Medical):   Marland Kitchen Lack of Transportation (Non-Medical):   Physical Activity:   . Days of Exercise per Week:   . Minutes of Exercise per Session:   Stress:   . Feeling of Stress :  Social Connections:   . Frequency of Communication with Friends and Family:   . Frequency of Social Gatherings with Friends and Family:   . Attends Religious Services:   . Active Member of Clubs or Organizations:   . Attends Archivist Meetings:   Marland Kitchen Marital Status:     Outpatient Encounter Medications as of 09/30/2019  Medication Sig  . albuterol (VENTOLIN HFA) 108 (90 Base) MCG/ACT inhaler INHALE 2 PUFFS BY MOUTH EVERY 6 HOURS AS NEEDED FOR WHEEZE OR SHORTNESS OF BREATH  . Azelastine HCl (ASTEPRO) 0.15 % SOLN Place 2 sprays into the nose daily. 1x per day (Patient taking differently: Place 2 sprays into the nose daily as needed. 1x per day)  . buPROPion (WELLBUTRIN XL) 150 MG 24 hr tablet Take 150 mg by mouth daily.  Marland Kitchen MELATONIN PO Take by mouth at bedtime.  . Multiple Vitamin (MULTIVITAMIN) capsule Take 1 capsule by  mouth daily.  . mupirocin ointment (BACTROBAN) 2 % Apply to affected area bid  . pantoprazole (PROTONIX) 40 MG tablet Take 1 tablet (40 mg total) by mouth 2 (two) times daily.  . traZODone (DESYREL) 50 MG tablet TAKE 1/2 TO 1 TABLET BY MOUTH AT BEDTIME AS NEEDED FOR SLEEP   No facility-administered encounter medications on file as of 09/30/2019.    Review of Systems  Constitutional: Negative for appetite change.       Has gained some weight.    HENT: Negative for congestion and sinus pressure.   Respiratory: Negative for cough, chest tightness and shortness of breath.   Cardiovascular: Negative for chest pain, palpitations and leg swelling.  Gastrointestinal: Negative for diarrhea, nausea and vomiting.       Right lower quadrant pain as outlined.    Genitourinary: Negative for difficulty urinating and dysuria.  Musculoskeletal: Negative for joint swelling and myalgias.  Skin: Negative for color change and rash.  Neurological: Negative for dizziness, light-headedness and headaches.  Psychiatric/Behavioral: Negative for agitation and dysphoric mood.       Objective:    Physical Exam Vitals reviewed.  Constitutional:      General: She is not in acute distress.    Appearance: Normal appearance.  HENT:     Head: Normocephalic and atraumatic.     Right Ear: External ear normal.     Left Ear: External ear normal.  Eyes:     General:        Right eye: No discharge.        Left eye: No discharge.     Conjunctiva/sclera: Conjunctivae normal.  Neck:     Thyroid: No thyromegaly.  Cardiovascular:     Rate and Rhythm: Normal rate and regular rhythm.  Pulmonary:     Effort: No respiratory distress.     Breath sounds: Normal breath sounds. No wheezing.  Abdominal:     General: Bowel sounds are normal.     Palpations: Abdomen is soft.     Tenderness: There is no abdominal tenderness.  Musculoskeletal:        General: No swelling or tenderness.     Cervical back: Neck supple. No  tenderness.  Lymphadenopathy:     Cervical: No cervical adenopathy.  Skin:    Findings: No erythema or rash.  Neurological:     Mental Status: She is alert.  Psychiatric:        Mood and Affect: Mood normal.        Behavior: Behavior normal.     BP 130/70   Pulse  75   Temp (!) 97.2 F (36.2 C)   Resp 16   Ht '5\' 6"'  (1.676 m)   Wt 177 lb (80.3 kg)   SpO2 99%   BMI 28.57 kg/m  Wt Readings from Last 3 Encounters:  09/30/19 177 lb (80.3 kg)  08/26/19 175 lb (79.4 kg)  08/04/19 170 lb (77.1 kg)     Lab Results  Component Value Date   WBC 6.4 08/04/2019   HGB 12.0 08/04/2019   HCT 36.1 08/04/2019   PLT 275.0 08/04/2019   GLUCOSE 109 (H) 07/05/2019   CHOL 248 (H) 04/28/2019   TRIG 125.0 04/28/2019   HDL 69.10 04/28/2019   LDLCALC 154 (H) 04/28/2019   ALT 24 07/04/2019   AST 20 07/04/2019   NA 140 07/05/2019   K 4.1 07/05/2019   CL 108 07/05/2019   CREATININE 0.84 07/05/2019   BUN 14 07/05/2019   CO2 25 07/05/2019   TSH 1.82 04/28/2019   INR 1.0 07/05/2019   HGBA1C 5.7 04/28/2019    DG Chest 2 View  Result Date: 08/26/2019 CLINICAL DATA:  History of COVID.  Cough.  Congestion. EXAM: CHEST - 2 VIEW COMPARISON:  06/18/2016. FINDINGS: Mediastinum and hilar structures normal. Lungs are clear. No pleural effusion or pneumothorax. No acute bony abnormality identified. IMPRESSION: No acute cardiopulmonary disease. Electronically Signed   By: Marcello Moores  Register   On: 08/26/2019 14:23       Assessment & Plan:   Problem List Items Addressed This Visit    Anemia    Follow cbc.       GERD (gastroesophageal reflux disease)    No upper symptoms reported.  On protonix.        Hyperglycemia    Low carb diet and exercise.  Follow met b and a1c.       Relevant Orders   Hepatic function panel   Lipid panel   Basic metabolic panel   Hemoglobin A1c   Lower abdominal pain    Persistent intermittent right lower quadrant pain.  CT no acute abnormality.  Discussed  referral to gyn.  Sees Dr Leonides Schanz.  States will f/u with gyn once Dr Leonides Schanz returns.        Sleeping difficulties    On trazodone.  Follow.        Vitamin D deficiency    Follow vitamin D level.           Einar Pheasant, MD

## 2019-10-02 ENCOUNTER — Encounter: Payer: Self-pay | Admitting: Internal Medicine

## 2019-10-02 NOTE — Assessment & Plan Note (Signed)
Follow cbc.  

## 2019-10-02 NOTE — Assessment & Plan Note (Signed)
Low carb diet and exercise.  Follow met b and a1c.  

## 2019-10-02 NOTE — Assessment & Plan Note (Signed)
No upper symptoms reported.  On protonix.   

## 2019-10-02 NOTE — Assessment & Plan Note (Signed)
Persistent intermittent right lower quadrant pain.  CT no acute abnormality.  Discussed referral to gyn.  Sees Dr Leonides Schanz.  States will f/u with gyn once Dr Leonides Schanz returns.

## 2019-10-02 NOTE — Assessment & Plan Note (Signed)
On trazodone.  Follow.  

## 2019-10-02 NOTE — Assessment & Plan Note (Signed)
Follow vitamin D level.  

## 2019-10-14 ENCOUNTER — Other Ambulatory Visit: Payer: Self-pay | Admitting: Internal Medicine

## 2019-10-14 ENCOUNTER — Encounter: Payer: Self-pay | Admitting: Internal Medicine

## 2019-10-14 NOTE — Telephone Encounter (Signed)
Rx has been sent in. 

## 2019-10-20 ENCOUNTER — Other Ambulatory Visit: Payer: Self-pay | Admitting: Internal Medicine

## 2019-10-28 ENCOUNTER — Other Ambulatory Visit (INDEPENDENT_AMBULATORY_CARE_PROVIDER_SITE_OTHER): Payer: 59

## 2019-10-28 ENCOUNTER — Other Ambulatory Visit: Payer: Self-pay

## 2019-10-28 DIAGNOSIS — R739 Hyperglycemia, unspecified: Secondary | ICD-10-CM | POA: Diagnosis not present

## 2019-10-28 LAB — BASIC METABOLIC PANEL
BUN: 15 mg/dL (ref 6–23)
CO2: 29 mEq/L (ref 19–32)
Calcium: 9.6 mg/dL (ref 8.4–10.5)
Chloride: 105 mEq/L (ref 96–112)
Creatinine, Ser: 0.98 mg/dL (ref 0.40–1.20)
GFR: 59.78 mL/min — ABNORMAL LOW (ref >60.00)
Glucose, Bld: 110 mg/dL — ABNORMAL HIGH (ref 70–99)
Potassium: 4.1 mEq/L (ref 3.5–5.1)
Sodium: 139 mEq/L (ref 135–145)

## 2019-10-28 LAB — HEPATIC FUNCTION PANEL
ALT: 16 U/L (ref 0–35)
AST: 18 U/L (ref 0–37)
Albumin: 4.3 g/dL (ref 3.5–5.2)
Alkaline Phosphatase: 81 U/L (ref 39–117)
Bilirubin, Direct: 0.1 mg/dL (ref 0.0–0.3)
Total Bilirubin: 0.5 mg/dL (ref 0.2–1.2)
Total Protein: 7.1 g/dL (ref 6.0–8.3)

## 2019-10-28 LAB — LIPID PANEL
Cholesterol: 223 mg/dL — ABNORMAL HIGH (ref 0–200)
HDL: 61.2 mg/dL (ref >39.00)
LDL Cholesterol: 136 mg/dL — ABNORMAL HIGH (ref 0–99)
NonHDL: 161.6
Total CHOL/HDL Ratio: 4
Triglycerides: 126 mg/dL (ref 0.0–149.0)
VLDL: 25.2 mg/dL (ref 0.0–40.0)

## 2019-10-28 LAB — HEMOGLOBIN A1C: Hgb A1c MFr Bld: 5.6 % (ref 4.6–6.5)

## 2019-10-31 ENCOUNTER — Encounter: Payer: Self-pay | Admitting: Internal Medicine

## 2019-10-31 NOTE — Telephone Encounter (Signed)
Called patient to confirm ok. No active symptoms at this time. Confirmed with her that she has not had any chest pain, chest tightness, shortness of breath, leg swelling, numbness or tingling etc. Has not had any acute symptoms today but this has been going on for over a week on and off. Patient did not want to go to [facility] or ED. Scheduled pt for in office visit on 7/28 and advised to go to acute care if any acute symptoms prior to appt. Pt agreed.

## 2019-11-02 ENCOUNTER — Encounter: Payer: Self-pay | Admitting: Internal Medicine

## 2019-11-02 ENCOUNTER — Other Ambulatory Visit: Payer: Self-pay

## 2019-11-02 ENCOUNTER — Ambulatory Visit (INDEPENDENT_AMBULATORY_CARE_PROVIDER_SITE_OTHER): Payer: 59 | Admitting: Internal Medicine

## 2019-11-02 DIAGNOSIS — R0789 Other chest pain: Secondary | ICD-10-CM

## 2019-11-13 ENCOUNTER — Encounter: Payer: Self-pay | Admitting: Internal Medicine

## 2019-11-13 DIAGNOSIS — R0789 Other chest pain: Secondary | ICD-10-CM | POA: Insufficient documentation

## 2019-11-13 NOTE — Progress Notes (Signed)
This visit occurred during the SARS-CoV-2 public health emergency.  Safety protocols were in place, including screening questions prior to the visit, additional usage of staff PPE, and extensive cleaning of exam room while observing appropriate contact time as indicated for disinfecting solutions.  Kelly Lowery was roomed and CMA discussed her current symptoms.  Pt reported that she was experiencing some sob this am.  Had called in with some pain in her left breast/left side.  Today reported some sob.  Question of previous cough/congestion.  After discussion with me, nurse returned to the room with a gown and appropriate PPE to discuss further evaluation and treatment with pt.  CMA informed me that when she walked into the room with PPE on, that pt informed her that she was not being tested for covid.  Did not allow CMA to explain plan of care, treatment, etc.  Became angry (cursing, etc).  Got up and dressed and left the clinic.

## 2019-11-13 NOTE — Assessment & Plan Note (Signed)
See note. Pt left without being seen.

## 2019-12-16 ENCOUNTER — Other Ambulatory Visit: Payer: Self-pay | Admitting: Internal Medicine

## 2020-01-30 ENCOUNTER — Ambulatory Visit: Payer: 59 | Admitting: Internal Medicine

## 2020-02-09 ENCOUNTER — Other Ambulatory Visit: Payer: Self-pay | Admitting: Internal Medicine

## 2020-03-26 ENCOUNTER — Ambulatory Visit: Payer: 59 | Attending: Internal Medicine

## 2020-03-26 DIAGNOSIS — Z23 Encounter for immunization: Secondary | ICD-10-CM

## 2020-03-26 NOTE — Progress Notes (Signed)
   Covid-19 Vaccination Clinic  Name:  Kelly Lowery    MRN: [id number] DOB: [birth year]  03/26/2020  Kelly Lowery was observed post Covid-19 immunization for 15 minutes without incident. She was provided with Vaccine Information Sheet and instruction to access the V-Safe system.   Kelly Lowery was instructed to call 911 with any severe reactions post vaccine: Marland Kitchen Difficulty breathing  . Swelling of face and throat  . A fast heartbeat  . A bad rash all over body  . Dizziness and weakness   Immunizations Administered    Name Date Dose VIS Date Route   Pfizer COVID-19 Vaccine 03/26/2020  2:51 PM 0.3 mL 01/25/2020 Intramuscular   Manufacturer: [place]   Lot: [id number]   [place]: [id number]

## 2020-04-16 ENCOUNTER — Ambulatory Visit: Payer: 59 | Attending: Internal Medicine

## 2020-04-16 ENCOUNTER — Other Ambulatory Visit: Payer: Self-pay | Admitting: Internal Medicine

## 2020-04-16 DIAGNOSIS — Z23 Encounter for immunization: Secondary | ICD-10-CM

## 2020-04-16 DIAGNOSIS — Z1231 Encounter for screening mammogram for malignant neoplasm of breast: Secondary | ICD-10-CM

## 2020-04-16 NOTE — Progress Notes (Signed)
   Covid-19 Vaccination Clinic  Name:  Kelly Lowery    MRN: [id number] DOB: [birth year]  04/16/2020  [name] was observed post Covid-19 immunization for 15 minutes without incident. She was provided with Vaccine Information Sheet and instruction to access the V-Safe system.   [name] was instructed to call 911 with any severe reactions post vaccine: Marland Kitchen Difficulty breathing  . Swelling of face and throat  . A fast heartbeat  . A bad rash all over body  . Dizziness and weakness   Immunizations Administered    Name Date Dose VIS Date Route   Pfizer COVID-19 Vaccine 04/16/2020  2:34 PM 0.3 mL 01/25/2020 Intramuscular   Manufacturer: [place]   Lot: [id number]   [place]: [id number]

## 2020-04-18 ENCOUNTER — Other Ambulatory Visit: Payer: Self-pay | Admitting: Internal Medicine

## 2020-05-10 ENCOUNTER — Ambulatory Visit (INDEPENDENT_AMBULATORY_CARE_PROVIDER_SITE_OTHER): Payer: 59 | Admitting: Internal Medicine

## 2020-05-10 ENCOUNTER — Other Ambulatory Visit: Payer: Self-pay

## 2020-05-10 DIAGNOSIS — Z0000 Encounter for general adult medical examination without abnormal findings: Secondary | ICD-10-CM

## 2020-05-10 NOTE — Progress Notes (Signed)
Patient ID: Kelly Lowery, female   DOB: 03/11/1971, 50 y.o.   MRN: [id number]   Subjective:    Patient ID: Kelly Lowery, female    DOB: [birth year], 50 y.o.   MRN: [id number]  HPI This visit occurred during the SARS-CoV-2 public health emergency.  Safety protocols were in place, including screening questions prior to the visit, additional usage of staff PPE, and extensive cleaning of exam room while observing appropriate contact time as indicated for disinfecting solutions.  Patient here for a scheduled yearly follow up. Last visit, here she left without being seen.  She called back to cancel appts.  Was upset last visit, because concern was raised (given her symptoms) regarding possible covid.  She was going to be advised she needed testing for covid, but became upset and left before further discussion.  See last note for details.  Discussed with her today our protocols.  Discussed reasons for our protocols and trying to keep patients safe.  Discussed that I would see her today and address and follow any acute issues, but explained given her reaction last visit and continued concerns about our protocols, that I feel she would need to find a new primary care physician.  Again offered to see her today, but she declined.    Past Medical History:  Diagnosis Date  . Breast cyst    s/p aspiration  . Complication of anesthesia    itchy face   Past Surgical History:  Procedure Laterality Date  . BREAST CYST ASPIRATION Left   . CESAREAN SECTION     two  . COLONOSCOPY WITH PROPOFOL N/A 06/16/2019   Procedure: COLONOSCOPY WITH PROPOFOL;  Surgeon: Lin Landsman, MD;  Location: [facility] ENDOSCOPY;  Service: Gastroenterology;  Laterality: N/A;  . ESOPHAGOGASTRODUODENOSCOPY (EGD) WITH PROPOFOL N/A 07/05/2019   Procedure: ESOPHAGOGASTRODUODENOSCOPY (EGD) WITH PROPOFOL;  Surgeon: Lucilla Lame, MD;  Location: [facility] ENDOSCOPY;  Service: Endoscopy;  Laterality: N/A;  . PARTIAL HYSTERECTOMY   12/30/2013  . TONSILLECTOMY  1974   Family History  Problem Relation Age of Onset  . Breast cancer Maternal Grandmother 47       47 and 60  . COPD Mother   . Diabetes Mother        borderline  . Heart disease Father   . COPD Father   . Lung cancer Paternal Aunt    Social History   Socioeconomic History  . Marital status: Married    Spouse name: Not on file  . Number of children: Not on file  . Years of education: Not on file  . Highest education level: Not on file  Occupational History  . Not on file  Tobacco Use  . Smoking status: Former Smoker    Packs/day: 0.50    Years: 20.00    Pack years: 10.00    Types: Cigarettes    Quit date: 06/02/2018    Years since quitting: 1.9  . Smokeless tobacco: Never Used  Vaping Use  . Vaping Use: Never used  Substance and Sexual Activity  . Alcohol use: Yes    Comment: occasionally  . Drug use: Yes    Frequency: 2.0 times per week    Types: Marijuana  . Sexual activity: Yes  Other Topics Concern  . Not on file  Social History Narrative   3 dogs labradors   Social Determinants of Health   Financial Resource Strain: Not on file  Food Insecurity: Not on file  Transportation Needs: Not on file  Physical Activity:  Not on file  Stress: Not on file  Social Connections: Not on file    Outpatient Encounter Medications as of 05/10/2020  Medication Sig  . acyclovir (ZOVIRAX) 400 MG tablet Take 400 mg by mouth 3 (three) times daily.  Marland Kitchen albuterol (VENTOLIN HFA) 108 (90 Base) MCG/ACT inhaler INHALE 2 PUFFS BY MOUTH EVERY 6 HOURS AS NEEDED FOR WHEEZE OR SHORTNESS OF BREATH  . Azelastine HCl (ASTEPRO) 0.15 % SOLN Place 2 sprays into the nose daily. 1x per day (Patient taking differently: Place 2 sprays into the nose daily as needed. 1x per day)  . buPROPion (WELLBUTRIN XL) 150 MG 24 hr tablet TAKE 1 TABLET BY MOUTH EVERY DAY  . MELATONIN PO Take by mouth at bedtime.  . Multiple Vitamin (MULTIVITAMIN) capsule Take 1 capsule by mouth  daily.  . mupirocin ointment (BACTROBAN) 2 % Apply to affected area bid  . pantoprazole (PROTONIX) 40 MG tablet TAKE 1 TABLET BY MOUTH TWICE A DAY  . traZODone (DESYREL) 50 MG tablet TAKE 1/2 TO 1 TABLET BY MOUTH AT BEDTIME AS NEEDED FOR SLEEP   No facility-administered encounter medications on file as of 05/10/2020.    Review of Systems Not performed.     Objective:    Physical Exam Not performed.  BP 118/70   Pulse 71   Temp 98 F (36.7 C) (Oral)   Resp 16   Ht 5\' 6"  (1.676 m)   Wt 176 lb 6.4 oz (80 kg)   SpO2 98%   BMI 28.47 kg/m  Wt Readings from Last 3 Encounters:  05/10/20 176 lb 6.4 oz (80 kg)  11/02/19 177 lb 3.2 oz (80.4 kg)  09/30/19 177 lb (80.3 kg)     Lab Results  Component Value Date   WBC 6.4 08/04/2019   HGB 12.0 08/04/2019   HCT 36.1 08/04/2019   PLT 275.0 08/04/2019   GLUCOSE 110 (H) 10/28/2019   CHOL 223 (H) 10/28/2019   TRIG 126.0 10/28/2019   HDL 61.20 10/28/2019   LDLCALC 136 (H) 10/28/2019   ALT 16 10/28/2019   AST 18 10/28/2019   NA 139 10/28/2019   K 4.1 10/28/2019   CL 105 10/28/2019   CREATININE 0.98 10/28/2019   BUN 15 10/28/2019   CO2 29 10/28/2019   TSH 1.82 04/28/2019   INR 1.0 07/05/2019   HGBA1C 5.6 10/28/2019    DG Chest 2 View  Result Date: 08/26/2019 CLINICAL DATA:  History of COVID.  Cough.  Congestion. EXAM: CHEST - 2 VIEW COMPARISON:  06/18/2016. FINDINGS: Mediastinum and hilar structures normal. Lungs are clear. No pleural effusion or pneumothorax. No acute bony abnormality identified. IMPRESSION: No acute cardiopulmonary disease. Electronically Signed   By: Marcello Moores  Register   On: 08/26/2019 14:23       Assessment & Plan:   Problem List Items Addressed This Visit    Health care maintenance    See note.           Einar Pheasant, MD

## 2020-05-12 ENCOUNTER — Encounter: Payer: Self-pay | Admitting: Internal Medicine

## 2020-05-12 NOTE — Assessment & Plan Note (Signed)
See note

## 2020-06-04 ENCOUNTER — Ambulatory Visit
Admission: RE | Admit: 2020-06-04 | Discharge: 2020-06-04 | Disposition: A | Discharge status: Home / Self Care | Payer: 59 | Source: Ambulatory Visit | Attending: Internal Medicine | Admitting: Internal Medicine

## 2020-06-04 ENCOUNTER — Other Ambulatory Visit: Payer: Self-pay

## 2020-06-04 DIAGNOSIS — Z1231 Encounter for screening mammogram for malignant neoplasm of breast: Secondary | ICD-10-CM | POA: Diagnosis not present

## 2020-06-23 ENCOUNTER — Other Ambulatory Visit: Payer: Self-pay | Admitting: Internal Medicine

## 2020-08-28 ENCOUNTER — Ambulatory Visit (INDEPENDENT_AMBULATORY_CARE_PROVIDER_SITE_OTHER): Payer: 59 | Admitting: Dermatology

## 2020-08-28 ENCOUNTER — Other Ambulatory Visit: Payer: Self-pay

## 2020-08-28 DIAGNOSIS — D170 Benign lipomatous neoplasm of skin and subcutaneous tissue of head, face and neck: Secondary | ICD-10-CM

## 2020-08-28 DIAGNOSIS — L720 Epidermal cyst: Secondary | ICD-10-CM

## 2020-08-28 DIAGNOSIS — L820 Inflamed seborrheic keratosis: Secondary | ICD-10-CM | POA: Diagnosis not present

## 2020-08-28 DIAGNOSIS — L578 Other skin changes due to chronic exposure to nonionizing radiation: Secondary | ICD-10-CM

## 2020-08-28 NOTE — Patient Instructions (Addendum)
Seborrheic Keratosis  What causes seborrheic keratoses? Seborrheic keratoses are harmless, common skin growths that first appear during adult life.  As time goes by, more growths appear.  Some people may develop a large number of them.  Seborrheic keratoses appear on both covered and uncovered body parts.  They are not caused by sunlight.  The tendency to develop seborrheic keratoses can be inherited.  They vary in color from skin-colored to gray, brown, or even black.  They can be either smooth or have a rough, warty surface.   Seborrheic keratoses are superficial and look as if they were stuck on the skin.  Under the microscope this type of keratosis looks like layers upon layers of skin.  That is why at times the top layer may seem to fall off, but the rest of the growth remains and re-grows.    Treatment Seborrheic keratoses do not need to be treated, but can easily be removed in the office.  Seborrheic keratoses often cause symptoms when they rub on clothing or jewelry.  Lesions can be in the way of shaving.  If they become inflamed, they can cause itching, soreness, or burning.  Removal of a seborrheic keratosis can be accomplished by freezing, burning, or surgery. If any spot bleeds, scabs, or grows rapidly, please return to have it checked, as these can be an indication of a skin cancer.  Cryotherapy Aftercare  . Wash gently with soap and water everyday.   Apply Vaseline and Band-Aid daily until healed.  If you have any questions or concerns for your doctor, please call our main line at [phone] and press option 4 to reach your doctor's medical assistant. If no one answers, please leave a voicemail as directed and we will return your call as soon as possible. Messages left after 4 pm will be answered the following business day.   You may also send [place] a message via [place]. We typically respond to MyChart messages within 1-2 business days.  For prescription refills, please ask your  pharmacy to contact our office. Our fax number is [phone].  If you have an urgent issue when the clinic is closed that cannot wait until the next business day, you can page your doctor at the number below.    Please note that while we do our best to be available for urgent issues outside of office hours, we are not available 24/7.   If you have an urgent issue and are unable to reach [place], you may choose to seek medical care at your doctor's office, retail clinic, urgent care center, or emergency room.  If you have a medical emergency, please immediately call 911 or go to the emergency department.  Pager Numbers  - Dr. Nehemiah Massed: [phone]  - Dr. Laurence Ferrari: [phone]  - Dr. Nicole Kindred: [phone]  In the event of inclement weather, please call our main line at [phone] for an update on the status of any delays or closures.  Dermatology Medication Tips: Please keep the boxes that topical medications come in in order to help keep track of the instructions about where and how to use these. Pharmacies typically print the medication instructions only on the boxes and not directly on the medication tubes.   If your medication is too expensive, please contact our office at [phone] option 4 or send [place] a message through [place].   We are unable to tell what your co-pay for medications will be in advance as this is different depending on your insurance coverage. However,  we may be able to find a substitute medication at lower cost or fill out paperwork to get insurance to cover a needed medication.   If a prior authorization is required to get your medication covered by your insurance company, please allow [place] 1-2 business days to complete this process.  Drug prices often vary depending on where the prescription is filled and some pharmacies may offer cheaper prices.  The website www.goodrx.com contains coupons for medications through different pharmacies. The prices here do not  account for what the cost may be with help from insurance (it may be cheaper with your insurance), but the website can give you the price if you did not use any insurance.  - You can print the associated coupon and take it with your prescription to the pharmacy.  - You may also stop by our office during regular business hours and pick up a GoodRx coupon card.  - If you need your prescription sent electronically to a different pharmacy, notify our office through [facility] or by phone at [phone] option 4.

## 2020-08-28 NOTE — Progress Notes (Signed)
   Follow-Up Visit   Subjective  Kelly Lowery is a 50 y.o. female who presents for the following: follow up (Patient here today for concerns about place at chest that hurts and is itchy. Patient reports spot has been there for a while but recently after a trip to beach became itchy and bothersome. Patient also reports cyst at right side of neck. Patient would like to have checked. She denies that it bothers her. ).  The following portions of the chart were reviewed this encounter and updated as appropriate:       Objective  Well appearing patient in no apparent distress; mood and affect are within normal limits.  A focused examination was performed including scalp, neck, chest. Relevant physical exam findings are noted in the Assessment and Plan.  Objective  right lower sternum: 1.2 x 0.8 cm Erythematous keratotic or waxy stuck-on papule    Objective  Left Parietal Scalp: 2 cm soft rubbery nodule   Objective  right upper neck: 1 cm firm subcutaneous nodule   Assessment & Plan  Inflamed seborrheic keratosis right lower sternum  Prior to procedure, discussed risks of blister formation, small wound, skin dyspigmentation, or rare scar following cryotherapy.   Recheck at follow up if still inflamed will consider biopsy      Destruction of lesion - right lower sternum  Destruction method: cryotherapy   Informed consent: discussed and consent obtained   Lesion destroyed using liquid nitrogen: Yes   Region frozen until ice ball extended beyond lesion: Yes   Outcome: patient tolerated procedure well with no complications   Post-procedure details: wound care instructions given    Lipoma of head Left Parietal Scalp  Benign-appearing, observe.     Epidermal inclusion cyst right upper neck  Benign-appearing. Exam most consistent with an epidermal inclusion cyst. Discussed that a cyst is a benign growth that can grow over time and sometimes get irritated or inflamed.  Recommend observation if it is not bothersome. Discussed option of surgical excision to remove it if it is growing, symptomatic, or other changes noted. Please call for new or changing lesions so they can be evaluated.       Actinic Damage - chronic, secondary to cumulative UV radiation exposure/sun exposure over time - diffuse scaly erythematous macules with underlying dyspigmentation - Recommend daily broad spectrum sunscreen SPF 30+ to sun-exposed areas, reapply every 2 hours as needed.  - Recommend staying in the shade or wearing long sleeves, sun glasses (UVA+UVB protection) and wide brim hats (4-inch brim around the entire circumference of the hat). - Call for new or changing lesions.  Return in about 2 months (around 10/28/2020) for isk follow up on chest, surgery for cyst at right upper neck.  I, Ruthell Rummage, CMA, am acting as scribe for Brendolyn Patty, MD.  Documentation: I have reviewed the above documentation for accuracy and completeness, and I agree with the above.  Brendolyn Patty MD

## 2020-10-17 ENCOUNTER — Other Ambulatory Visit: Payer: Self-pay | Admitting: Internal Medicine

## 2020-10-24 ENCOUNTER — Other Ambulatory Visit: Payer: Self-pay | Admitting: Internal Medicine

## 2020-10-30 ENCOUNTER — Other Ambulatory Visit: Payer: Self-pay

## 2020-10-30 ENCOUNTER — Ambulatory Visit: Payer: 59 | Admitting: Dermatology

## 2020-10-30 DIAGNOSIS — L739 Follicular disorder, unspecified: Secondary | ICD-10-CM

## 2020-10-30 DIAGNOSIS — L720 Epidermal cyst: Secondary | ICD-10-CM | POA: Diagnosis not present

## 2020-10-30 DIAGNOSIS — L820 Inflamed seborrheic keratosis: Secondary | ICD-10-CM

## 2020-10-30 NOTE — Progress Notes (Signed)
   Follow-Up Visit   Subjective  Kelly Lowery is a 50 y.o. female who presents for the following: Follow-up (Patient here for follow up on isk at chest. At last follow up she had treated with Ln2.  She reports area doesn't itch anymore. She reports a new spot that has appeared at right breast. She also would like a cyst on right side of neck checked. ). It recently got inflamed and she was prescribed Sulfa abx.  She is scheduled for surgery here to get it removed.  She also has another bump on the back of her leg that she picks off and it grows back.   The following portions of the chart were reviewed this encounter and updated as appropriate:      Objective  Well appearing patient in no apparent distress; mood and affect are within normal limits.  A focused examination was performed including chest, right breast, right posterior thigh, right upper neck. Relevant physical exam findings are noted in the Assessment and Plan.  R upper breast Erythematous keratotic or waxy stuck-on papule at right upper breast  Hypopigmented firm papule consistent with hypertrophic scar at mid chest   right upper neck 1.2 cm firm subcutaneous nodule with erythema at right upper neck  Right Thigh - Posterior Light pink follicular papule  Assessment & Plan  Inflamed seborrheic keratosis R upper breast  Clear today at chest, with hypertrophic scar post cryotherapy, not bothersome to patient  New ISK R upper breast treated today       Destruction of lesion - R upper breast  Destruction method: cryotherapy   Informed consent: discussed and consent obtained   Lesion destroyed using liquid nitrogen: Yes   Region frozen until ice ball extended beyond lesion: Yes   Outcome: patient tolerated procedure well with no complications   Post-procedure details: wound care instructions given   Additional details:  Prior to procedure, discussed risks of blister formation, small wound, skin dyspigmentation,  or rare scar following cryotherapy. Recommend Vaseline ointment to treated areas while healing.   Epidermal inclusion cyst right upper neck  H/o inflammation, improving  Recommend to start antibiotic prescribed by pcp if gets inflamed again before her surgery appointment.   Cyst with symptoms and/or recent change.  Discussed surgical excision to remove, including resulting scar and possible recurrence.  Patient scheduled for surgery on August 22, [id number]     Folliculitis Right Thigh - Posterior  Folliculitis vs resolving cyst- benign-appearing  Will recheck at next follow up Avoid picking  Return for surgery cyst R neck. I, Ruthell Rummage, CMA, am acting as scribe for Brendolyn Patty, MD.  Documentation: I have reviewed the above documentation for accuracy and completeness, and I agree with the above.  Brendolyn Patty MD

## 2020-10-30 NOTE — Patient Instructions (Signed)

## 2020-11-26 ENCOUNTER — Ambulatory Visit: Payer: 59 | Admitting: Dermatology

## 2020-11-26 ENCOUNTER — Other Ambulatory Visit: Payer: Self-pay

## 2020-11-26 DIAGNOSIS — L720 Epidermal cyst: Secondary | ICD-10-CM

## 2020-11-26 DIAGNOSIS — D485 Neoplasm of uncertain behavior of skin: Secondary | ICD-10-CM

## 2020-11-26 NOTE — Patient Instructions (Signed)
Wound Care Instructions  Cleanse wound gently with soap and water once a day then pat dry with clean gauze. Apply a thing coat of Petrolatum (petroleum jelly, "Vaseline") over the wound (unless you have an allergy to this). We recommend that you use a new, sterile tube of Vaseline. Do not pick or remove scabs. Do not remove the yellow or white "healing tissue" from the base of the wound.  Cover the wound with fresh, clean, nonstick gauze and secure with paper tape. You may use Band-Aids in place of gauze and tape if the would is small enough, but would recommend trimming much of the tape off as there is often too much. Sometimes Band-Aids can irritate the skin.  You should call the office for your biopsy report after 1 week if you have not already been contacted.  If you experience any problems, such as abnormal amounts of bleeding, swelling, significant bruising, significant pain, or evidence of infection, please call the office immediately.  FOR ADULT SURGERY PATIENTS: If you need something for pain relief you may take 1 extra strength Tylenol (acetaminophen) AND 2 Ibuprofen (200mg each) together every 4 hours as needed for pain. (do not take these if you are allergic to them or if you have a reason you should not take them.) Typically, you may only need pain medication for 1 to 3 days.   If you have any questions or concerns for your doctor, please call our main line at [phone] and press option 4 to reach your doctor's medical assistant. If no one answers, please leave a voicemail as directed and we will return your call as soon as possible. Messages left after 4 pm will be answered the following business day.   You may also send us a message via MyChart. We typically respond to MyChart messages within 1-2 business days.  For prescription refills, please ask your pharmacy to contact our office. Our fax number is [phone].  If you have an urgent issue when the clinic is closed that  cannot wait until the next business day, you can page your doctor at the number below.    Please note that while we do our best to be available for urgent issues outside of office hours, we are not available 24/7.   If you have an urgent issue and are unable to reach us, you may choose to seek medical care at your doctor's office, retail clinic, urgent care center, or emergency room.  If you have a medical emergency, please immediately call 911 or go to the emergency department.  Pager Numbers  - Dr. Kowalski: [phone]  - Dr. Moye: [phone]  - Dr. Stewart: [phone]  In the event of inclement weather, please call our main line at [phone] for an update on the status of any delays or closures.  Dermatology Medication Tips: Please keep the boxes that topical medications come in in order to help keep track of the instructions about where and how to use these. Pharmacies typically print the medication instructions only on the boxes and not directly on the medication tubes.   If your medication is too expensive, please contact our office at [phone] option 4 or send us a message through MyChart.   We are unable to tell what your co-pay for medications will be in advance as this is different depending on your insurance coverage. However, we may be able to find a substitute medication at lower cost or fill out paperwork to get insurance to cover a needed   medication.   If a prior authorization is required to get your medication covered by your insurance company, please allow us 1-2 business days to complete this process.  Drug prices often vary depending on where the prescription is filled and some pharmacies may offer cheaper prices.  The website www.goodrx.com contains coupons for medications through different pharmacies. The prices here do not account for what the cost may be with help from insurance (it may be cheaper with your insurance), but the website can give you the  price if you did not use any insurance.  - You can print the associated coupon and take it with your prescription to the pharmacy.  - You may also stop by our office during regular business hours and pick up a GoodRx coupon card.  - If you need your prescription sent electronically to a different pharmacy, notify our office through [place] MyChart or by phone at [phone] option 4.   

## 2020-11-26 NOTE — Progress Notes (Signed)
   Follow-Up Visit   Subjective  Kelly Lowery is a 50 y.o. female who presents for the following: Cyst (Right upper neck. Patient presents for excison.).   The following portions of the chart were reviewed this encounter and updated as appropriate:       Review of Systems:  No other skin or systemic complaints except as noted in HPI or Assessment and Plan.  Objective  Well appearing patient in no apparent distress; mood and affect are within normal limits.  A focused examination was performed including face, neck. Relevant physical exam findings are noted in the Assessment and Plan.  right upper neck 0.6 x 1.1 cm firm subcutaneous nodule    Assessment & Plan  Neoplasm of uncertain behavior of skin right upper neck  Skin excision  Lesion length (cm):  0.6 Lesion width (cm):  1.1 Margin per side (cm):  0.1 Total excision diameter (cm):  1.3 Informed consent: discussed and consent obtained   Timeout: patient name, date of birth, surgical site, and procedure verified   Procedure prep:  Patient was prepped and draped in usual sterile fashion Prep type:  Povidone-iodine Anesthesia: the lesion was anesthetized in a standard fashion   Anesthesia comment:  Total 6cc - 3.0cc lido w/epi, 3.0cc 0.5% bupivicaine Anesthetic:  1% lidocaine w/ epinephrine 1-100,000 buffered w/ 8.4% NaHCO3 (0.5% bupivicaine) Instrument used comment:  #15c blade Hemostasis achieved with: pressure and electrodesiccation   Outcome: patient tolerated procedure well with no complications    Skin repair Complexity:  Intermediate Final length (cm):  1.5 Informed consent: discussed and consent obtained   Timeout: patient name, date of birth, surgical site, and procedure verified   Reason for type of repair: reduce tension to allow closure, reduce the risk of dehiscence, infection, and necrosis, reduce subcutaneous dead space and avoid a hematoma, preserve normal anatomical and functional relationships and  enhance both functionality and cosmetic results   Undermining: edges undermined   Subcutaneous layers (deep stitches):  Suture size:  4-0 Suture type: Vicryl (polyglactin 910)   Stitches:  Buried vertical mattress Fine/surface layer approximation (top stitches):  Suture size:  4-0 Suture type: nylon   Stitches: simple interrupted   Suture removal (days):  7 Hemostasis achieved with: suture Outcome: patient tolerated procedure well with no complications   Post-procedure details: sterile dressing applied and wound care instructions given   Dressing type: pressure dressing (mupirocin)    Specimen 1 - Surgical pathology Differential Diagnosis: Cyst vs other Check Margins: No 0.6 x 1.1 cm firm subcutaneous nodule  Return in about 1 week (around 12/03/2020) for suture removal.  I, Jamesetta Orleans, CMA, am acting as scribe for Brendolyn Patty, MD . Documentation: I have reviewed the above documentation for accuracy and completeness, and I agree with the above.  Brendolyn Patty MD

## 2020-11-27 ENCOUNTER — Telehealth: Payer: Self-pay

## 2020-11-27 NOTE — Telephone Encounter (Signed)
Left message for patient to call with any problems from surgery yesterday.  

## 2020-12-03 ENCOUNTER — Other Ambulatory Visit: Payer: Self-pay

## 2020-12-03 ENCOUNTER — Ambulatory Visit (INDEPENDENT_AMBULATORY_CARE_PROVIDER_SITE_OTHER): Payer: 59 | Admitting: Dermatology

## 2020-12-03 DIAGNOSIS — L720 Epidermal cyst: Secondary | ICD-10-CM

## 2020-12-03 NOTE — Progress Notes (Signed)
   Follow-Up Visit   Subjective  Kelly Lowery is a 50 y.o. female who presents for the following: Cyst (Right upper neck, post op.).   The following portions of the chart were reviewed this encounter and updated as appropriate:       Review of Systems:  No other skin or systemic complaints except as noted in HPI or Assessment and Plan.  Objective  Well appearing patient in no apparent distress; mood and affect are within normal limits.  A focused examination was performed including neck. Relevant physical exam findings are noted in the Assessment and Plan.  Right Upper Neck Excision site healing well, no evidence of infection    Assessment & Plan  Epidermoid cyst Right Upper Neck  Wound cleansed, sutures removed, wound cleansed and steri strips applied. Discussed pathology results. Benign cyst   Return if symptoms worsen or fail to improve.  IJamesetta Orleans, CMA, am acting as scribe for Brendolyn Patty, MD .  Documentation: I have reviewed the above documentation for accuracy and completeness, and I agree with the above.  Brendolyn Patty MD

## 2020-12-03 NOTE — Patient Instructions (Signed)

## 2021-02-04 ENCOUNTER — Other Ambulatory Visit: Payer: Self-pay | Admitting: Internal Medicine

## 2021-04-16 ENCOUNTER — Other Ambulatory Visit: Payer: Self-pay | Admitting: Family Medicine

## 2021-04-16 ENCOUNTER — Other Ambulatory Visit: Payer: Self-pay | Admitting: Internal Medicine

## 2021-04-16 DIAGNOSIS — Z1231 Encounter for screening mammogram for malignant neoplasm of breast: Secondary | ICD-10-CM

## 2021-04-29 ENCOUNTER — Other Ambulatory Visit: Payer: Self-pay | Admitting: Internal Medicine

## 2021-06-05 ENCOUNTER — Ambulatory Visit
Admission: RE | Admit: 2021-06-05 | Discharge: 2021-06-05 | Disposition: A | Discharge status: Home / Self Care | Payer: BC Managed Care – PPO | Source: Ambulatory Visit | Attending: Family Medicine | Admitting: Family Medicine

## 2021-06-05 ENCOUNTER — Other Ambulatory Visit: Payer: Self-pay

## 2021-06-05 DIAGNOSIS — Z1231 Encounter for screening mammogram for malignant neoplasm of breast: Secondary | ICD-10-CM | POA: Diagnosis not present

## 2021-06-11 ENCOUNTER — Other Ambulatory Visit: Payer: Self-pay | Admitting: Family Medicine

## 2021-06-11 DIAGNOSIS — R928 Other abnormal and inconclusive findings on diagnostic imaging of breast: Secondary | ICD-10-CM

## 2021-06-11 DIAGNOSIS — N63 Unspecified lump in unspecified breast: Secondary | ICD-10-CM

## 2021-06-18 ENCOUNTER — Ambulatory Visit
Admission: RE | Admit: 2021-06-18 | Discharge: 2021-06-18 | Disposition: A | Discharge status: Home / Self Care | Payer: BC Managed Care – PPO | Source: Ambulatory Visit | Attending: Family Medicine | Admitting: Family Medicine

## 2021-06-18 ENCOUNTER — Other Ambulatory Visit: Payer: Self-pay

## 2021-06-18 DIAGNOSIS — N63 Unspecified lump in unspecified breast: Secondary | ICD-10-CM

## 2021-06-18 DIAGNOSIS — R928 Other abnormal and inconclusive findings on diagnostic imaging of breast: Secondary | ICD-10-CM | POA: Insufficient documentation

## 2021-06-19 ENCOUNTER — Ambulatory Visit: Payer: BC Managed Care – PPO | Admitting: Dermatology

## 2021-06-19 DIAGNOSIS — L814 Other melanin hyperpigmentation: Secondary | ICD-10-CM | POA: Diagnosis not present

## 2021-06-19 DIAGNOSIS — L820 Inflamed seborrheic keratosis: Secondary | ICD-10-CM | POA: Diagnosis not present

## 2021-06-19 NOTE — Patient Instructions (Addendum)
Seborrheic Keratosis ? ?What causes seborrheic keratoses? ?Seborrheic keratoses are harmless, common skin growths that first appear during adult life.  As time goes by, more growths appear.  Some people may develop a large number of them.  Seborrheic keratoses appear on both covered and uncovered body parts.  They are not caused by sunlight.  The tendency to develop seborrheic keratoses can be inherited.  They vary in color from skin-colored to gray, brown, or even black.  They can be either smooth or have a rough, warty surface.   ?Seborrheic keratoses are superficial and look as if they were stuck on the skin.  Under the microscope this type of keratosis looks like layers upon layers of skin.  That is why at times the top layer may seem to fall off, but the rest of the growth remains and re-grows.   ? ?Treatment ?Seborrheic keratoses do not need to be treated, but can easily be removed in the office.  Seborrheic keratoses often cause symptoms when they rub on clothing or jewelry.  Lesions can be in the way of shaving.  If they become inflamed, they can cause itching, soreness, or burning.  Removal of a seborrheic keratosis can be accomplished by freezing, burning, or surgery. ?If any spot bleeds, scabs, or grows rapidly, please return to have it checked, as these can be an indication of a skin cancer. ? ?Cryotherapy Aftercare ? ?Wash gently with soap and water everyday.   ?Apply Vaseline and Band-Aid daily until healed.  ? ? ?Recommend daily broad spectrum sunscreen SPF 30+ to sun-exposed areas, reapply every 2 hours as needed. Call for new or changing lesions.  ?Staying in the shade or wearing long sleeves, sun glasses (UVA+UVB protection) and wide brim hats (4-inch brim around the entire circumference of the hat) are also recommended for sun protection.   ? ? ? ?If You Need Anything After Your Visit ? ?If you have any questions or concerns for your doctor, please call our main line at [phone] and press  option 4 to reach your doctor's medical assistant. If no one answers, please leave a voicemail as directed and we will return your call as soon as possible. Messages left after 4 pm will be answered the following business day.  ? ?You may also send [place] a message via MyChart. We typically respond to MyChart messages within 1-2 business days. ? ?For prescription refills, please ask your pharmacy to contact our office. Our fax number is [phone]. ? ?If you have an urgent issue when the clinic is closed that cannot wait until the next business day, you can page your doctor at the number below.   ? ?Please note that while we do our best to be available for urgent issues outside of office hours, we are not available 24/7.  ? ?If you have an urgent issue and are unable to reach [place], you may choose to seek medical care at your doctor's office, retail clinic, urgent care center, or emergency room. ? ?If you have a medical emergency, please immediately call 911 or go to the emergency department. ? ?Pager Numbers ? ?- Dr. Nehemiah Massed: [phone] ? ?- Dr. Laurence Ferrari: [phone] ? ?- Dr. Nicole Kindred: [phone] ? ?In the event of inclement weather, please call our main line at [phone] for an update on the status of any delays or closures. ? ?Dermatology Medication Tips: ?Please keep the boxes that topical medications come in in order to help keep track of the instructions about where and how to  use these. Pharmacies typically print the medication instructions only on the boxes and not directly on the medication tubes.  ? ?If your medication is too expensive, please contact our office at [phone] option 4 or send [place] a message through [place].  ? ?We are unable to tell what your co-pay for medications will be in advance as this is different depending on your insurance coverage. However, we may be able to find a substitute medication at lower cost or fill out paperwork to get insurance to cover a needed medication.  ? ?If a  prior authorization is required to get your medication covered by your insurance company, please allow [place] 1-2 business days to complete this process. ? ?Drug prices often vary depending on where the prescription is filled and some pharmacies may offer cheaper prices. ? ?The website www.goodrx.com contains coupons for medications through different pharmacies. The prices here do not account for what the cost may be with help from insurance (it may be cheaper with your insurance), but the website can give you the price if you did not use any insurance.  ?- You can print the associated coupon and take it with your prescription to the pharmacy.  ?- You may also stop by our office during regular business hours and pick up a GoodRx coupon card.  ?- If you need your prescription sent electronically to a different pharmacy, notify our office through [facility] or by phone at [phone] option 4. ? ? ? ? ?Si Usted Necesita Algo Despu?s de Su Visita ? ?Tambi?n puede enviarnos un mensaje a trav?s de MyChart. Por lo general respondemos a los mensajes de MyChart en el transcurso de 1 a 2 d?as h?biles. ? ?Para renovar recetas, por favor pida a su farmacia que se ponga en contacto con nuestra oficina. Nuestro n?mero de fax es el [phone]. ? ?Si tiene un asunto urgente cuando la cl?nica est? cerrada y que no puede esperar hasta el siguiente d?a h?bil, puede llamar/localizar a su doctor(a) al n?mero que aparece a continuaci?n.  ? ?Por favor, tenga en cuenta que aunque hacemos todo lo posible para estar disponibles para asuntos urgentes fuera del horario de oficina, no estamos disponibles las 24 horas del d?a, los 7 d?as de la semana.  ? ?Si tiene un problema urgente y no puede comunicarse con nosotros, puede optar por buscar atenci?n m?dica  en el consultorio de su doctor(a), en una cl?nica privada, en un centro de atenci?n urgente o en una sala de emergencias. ? ?Si tiene Engineer, maintenance (IT) m?dica, por favor llame  inmediatamente al 911 o vaya a la sala de emergencias. ? ?N?meros de b?per ? ?- Dr. Nehemiah Massed: [phone] ? ?- Dra. Moye: [phone] ? ?- Dra. Nicole Kindred: [phone] ? ?En caso de inclemencias del tiempo, por favor llame a nuestra l?nea principal al [phone] para una actualizaci?n sobre el estado de cualquier retraso o cierre. ? ?Consejos para la medicaci?n en dermatolog?a: ?Por favor, guarde las cajas en las que vienen los medicamentos de uso t?pico para ayudarle a seguir las instrucciones sobre d?nde y c?mo usarlos. Las farmacias generalmente imprimen las instrucciones del medicamento s?lo en las cajas y no directamente en los tubos del [place].  ? ?Si su medicamento es muy caro, por favor, p?ngase en contacto con Zigmund Daniel llamando al [phone] y presione la opci?n 4 o env?enos un mensaje a trav?s de MyChart.  ? ?No podemos decirle cu?l ser? su copago por los medicamentos por adelantado ya que esto es diferente dependiendo de  la cobertura de su seguro. Sin embargo, es posible que podamos encontrar un medicamento sustituto a Electrical engineer un formulario para que el seguro cubra el medicamento que se considera necesario.  ? ?Si se requiere Ardelia Mems autorizaci?n previa para que su compa??a de seguros [place] su medicamento, por favor perm?tanos de 1 a 2 d?as h?biles para completar este proceso. ? ?Los precios de los medicamentos var?an con frecuencia dependiendo del Environmental consultant de d?nde se surte la receta y alguna farmacias pueden ofrecer precios m?s baratos. ? ?El sitio web www.goodrx.com tiene cupones para medicamentos de Airline pilot. Los precios aqu? no tienen en cuenta lo que podr?a costar con la ayuda del seguro (puede ser m?s barato con su seguro), pero el sitio web puede darle el precio si no utiliz? ning?n seguro.  ?- Puede imprimir el cup?n correspondiente y llevarlo con su receta a la farmacia.  ?- Tambi?n puede pasar por nuestra oficina durante el horario de atenci?n regular y recoger  una tarjeta de cupones de GoodRx.  ?- Si necesita que su receta se env?e electr?nicamente a Chiropodist, informe a nuestra oficina a trav?s de MyChart [place] o por tel?fono llamando al [phone]

## 2021-06-19 NOTE — Progress Notes (Signed)
? ?  Follow-Up Visit ?  ?Subjective  ?Kelly Lowery is a 51 y.o. female who presents for the following: Follow-up (Patient reports a spot at left side of neck that is crusty and hurts occasionally. Patient reports a spot at back of right thigh she would like checked. ). ? ?The patient has spots, moles and lesions to be evaluated, some may be new or changing and the patient has concerns that these could be cancer. ? ? ?The following portions of the chart were reviewed this encounter and updated as appropriate:   ?  ? ?Review of Systems: No other skin or systemic complaints except as noted in HPI or Assessment and Plan. ? ? ?Objective  ?Well appearing patient in no apparent distress; mood and affect are within normal limits. ? ?A focused examination was performed including left neck, right thigh . Relevant physical exam findings are noted in the Assessment and Plan. ? ?left neck x 1, right posterior upper thigh x 1 (2) ?Erythematous stuck-on, waxy papule neck ?Small keratotic papule thigh ? ? ?Assessment & Plan  ?Inflamed seborrheic keratosis (2) ?left neck x 1, right posterior upper thigh x 1 ? ?Vs wart on right post upper thigh ? ?Destruction of lesion - left neck x 1, right posterior upper thigh x 1 ? ?Destruction method: cryotherapy   ?Informed consent: discussed and consent obtained   ?Lesion destroyed using liquid nitrogen: Yes   ?Region frozen until ice ball extended beyond lesion: Yes   ?Outcome: patient tolerated procedure well with no complications   ?Post-procedure details: wound care instructions given   ?Additional details:  Prior to procedure, discussed risks of blister formation, small wound, skin dyspigmentation, or rare scar following cryotherapy. Recommend Vaseline ointment to treated areas while healing. ? ? ? ?Lentigines ?- Scattered tan macules ?- Due to sun exposure ?- Benign-appering, observe ?- Recommend daily broad spectrum sunscreen SPF 30+ to sun-exposed areas, reapply every 2 hours as  needed. ?- Call for any changes ? ?Return if symptoms worsen or fail to improve. ?I, Ruthell Rummage, CMA, am acting as scribe for Brendolyn Patty, MD. ? ?Documentation: I have reviewed the above documentation for accuracy and completeness, and I agree with the above. ? ?Brendolyn Patty MD  ? ?

## 2021-06-25 ENCOUNTER — Other Ambulatory Visit: Payer: Self-pay | Admitting: Family Medicine

## 2021-06-25 DIAGNOSIS — N63 Unspecified lump in unspecified breast: Secondary | ICD-10-CM

## 2021-06-25 DIAGNOSIS — R928 Other abnormal and inconclusive findings on diagnostic imaging of breast: Secondary | ICD-10-CM

## 2021-07-02 ENCOUNTER — Other Ambulatory Visit: Payer: Self-pay | Admitting: Family Medicine

## 2021-07-02 ENCOUNTER — Other Ambulatory Visit: Payer: Self-pay

## 2021-07-02 ENCOUNTER — Ambulatory Visit
Admission: RE | Admit: 2021-07-02 | Discharge: 2021-07-02 | Disposition: A | Discharge status: Home / Self Care | Payer: BC Managed Care – PPO | Source: Ambulatory Visit | Attending: Family Medicine | Admitting: Family Medicine

## 2021-07-02 DIAGNOSIS — N63 Unspecified lump in unspecified breast: Secondary | ICD-10-CM

## 2021-07-02 DIAGNOSIS — R928 Other abnormal and inconclusive findings on diagnostic imaging of breast: Secondary | ICD-10-CM | POA: Diagnosis present

## 2021-07-20 IMAGING — CR DG ABDOMEN ACUTE W/ 1V CHEST
1 series · 3 of 3 positions shown · non-contrast
Comparison: Chest x-ray 06/18/2016

CLINICAL DATA: Nausea, vomiting and diarrhea.

EXAM:
DG ABDOMEN ACUTE W/ 1V CHEST

[Series 1: view not recorded · 0.14mm/px · 3 of 3 slices shown]
[im 1/3]
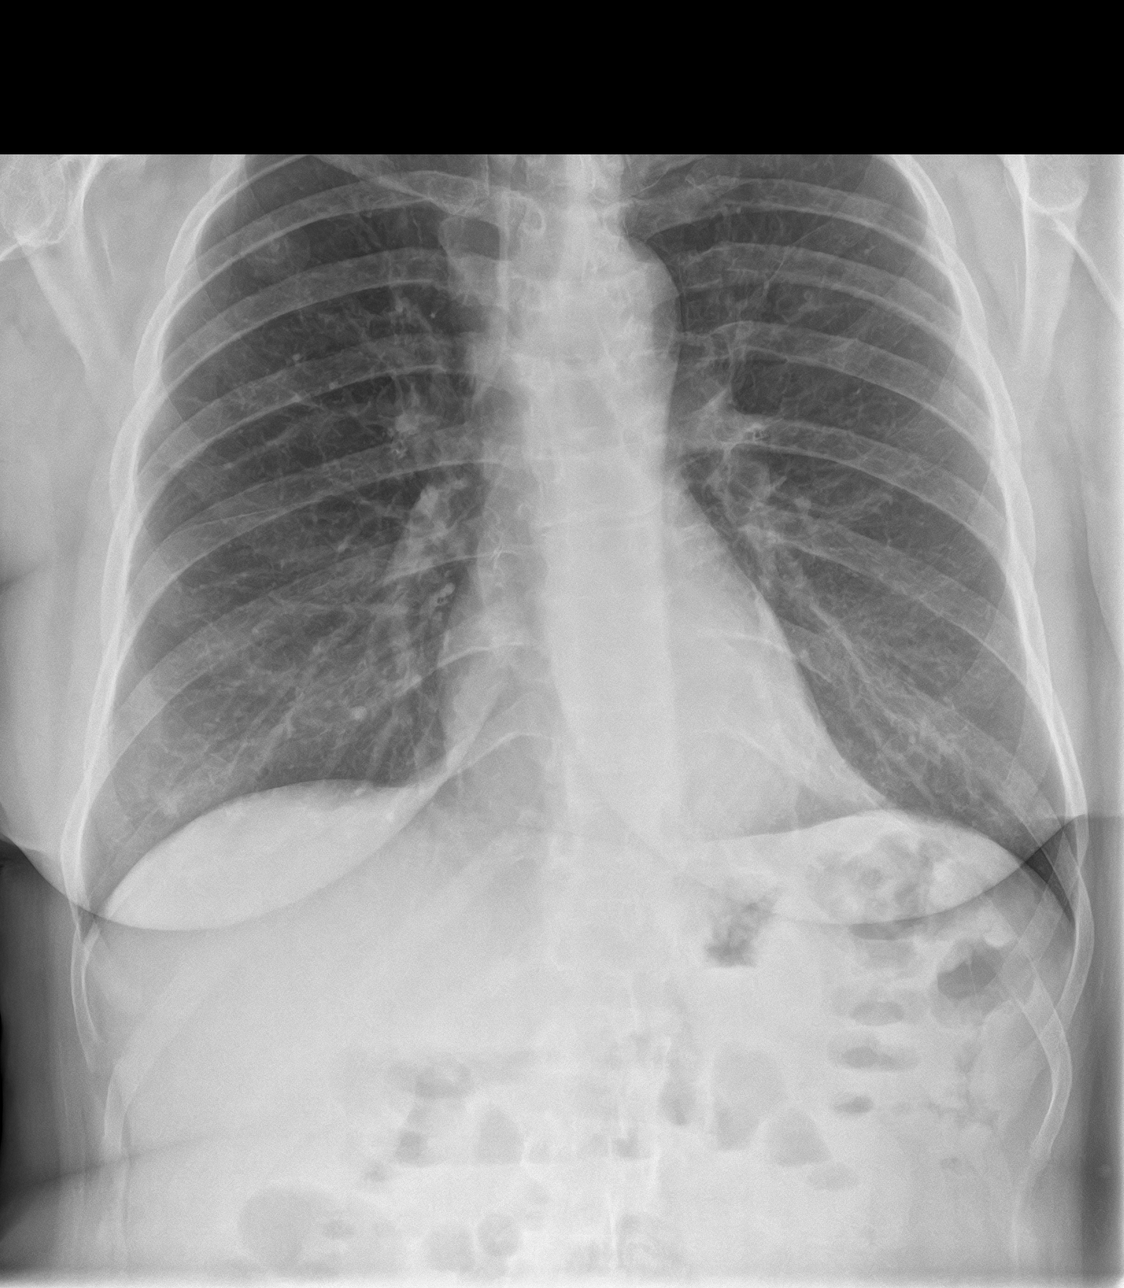
[im 2/3]
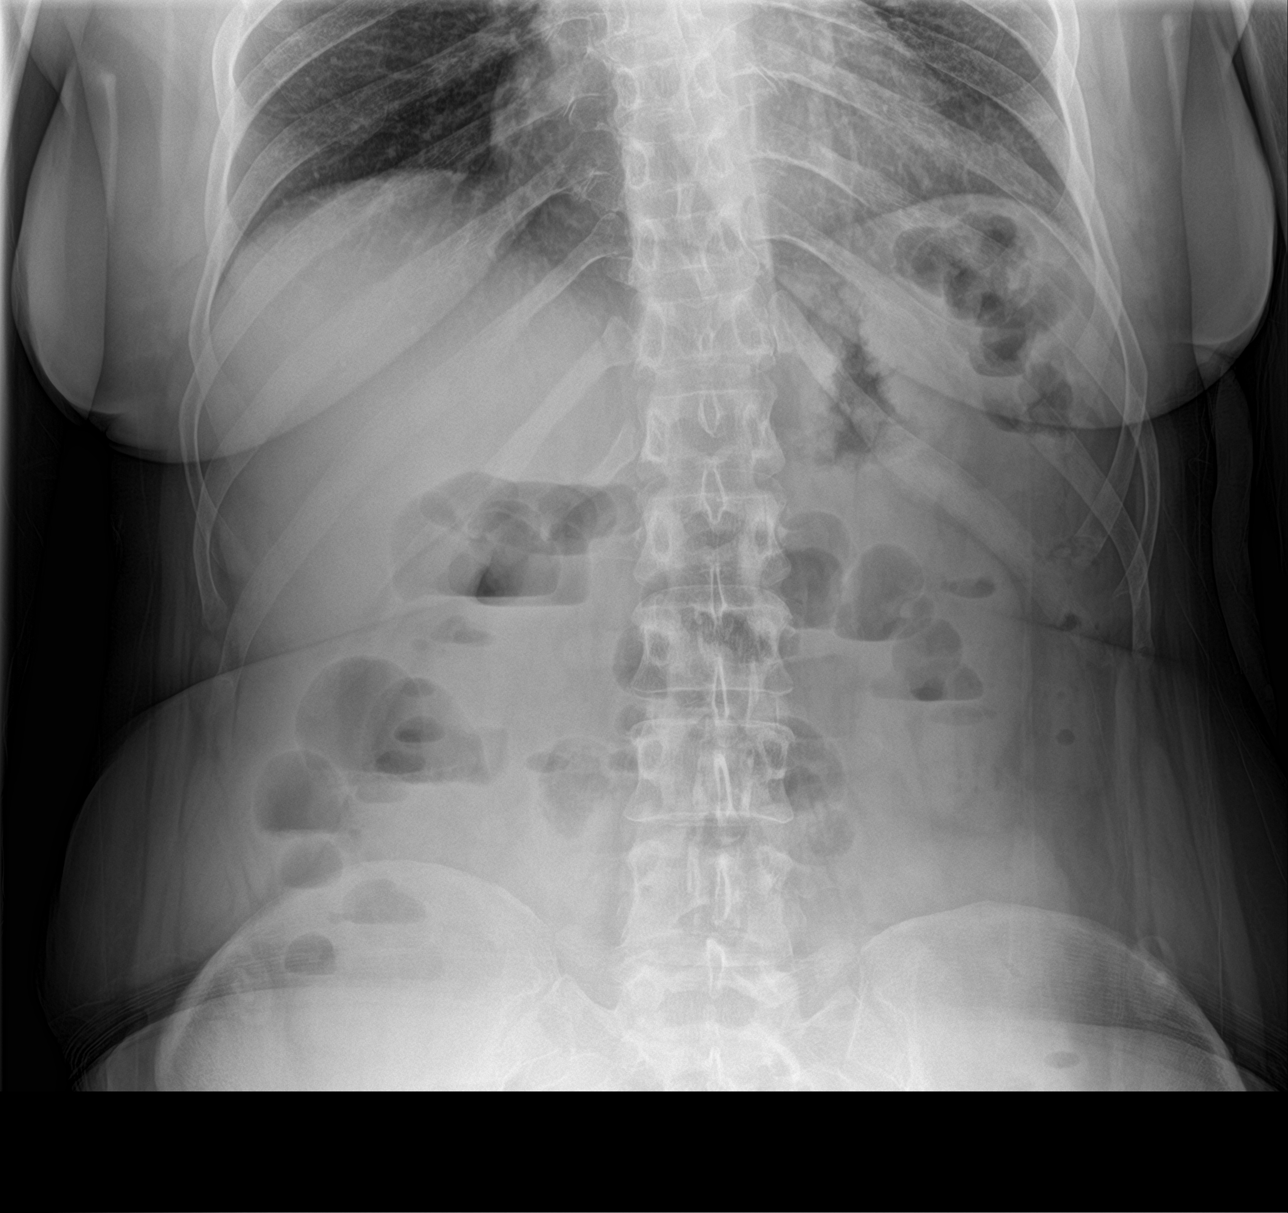
[im 3/3]
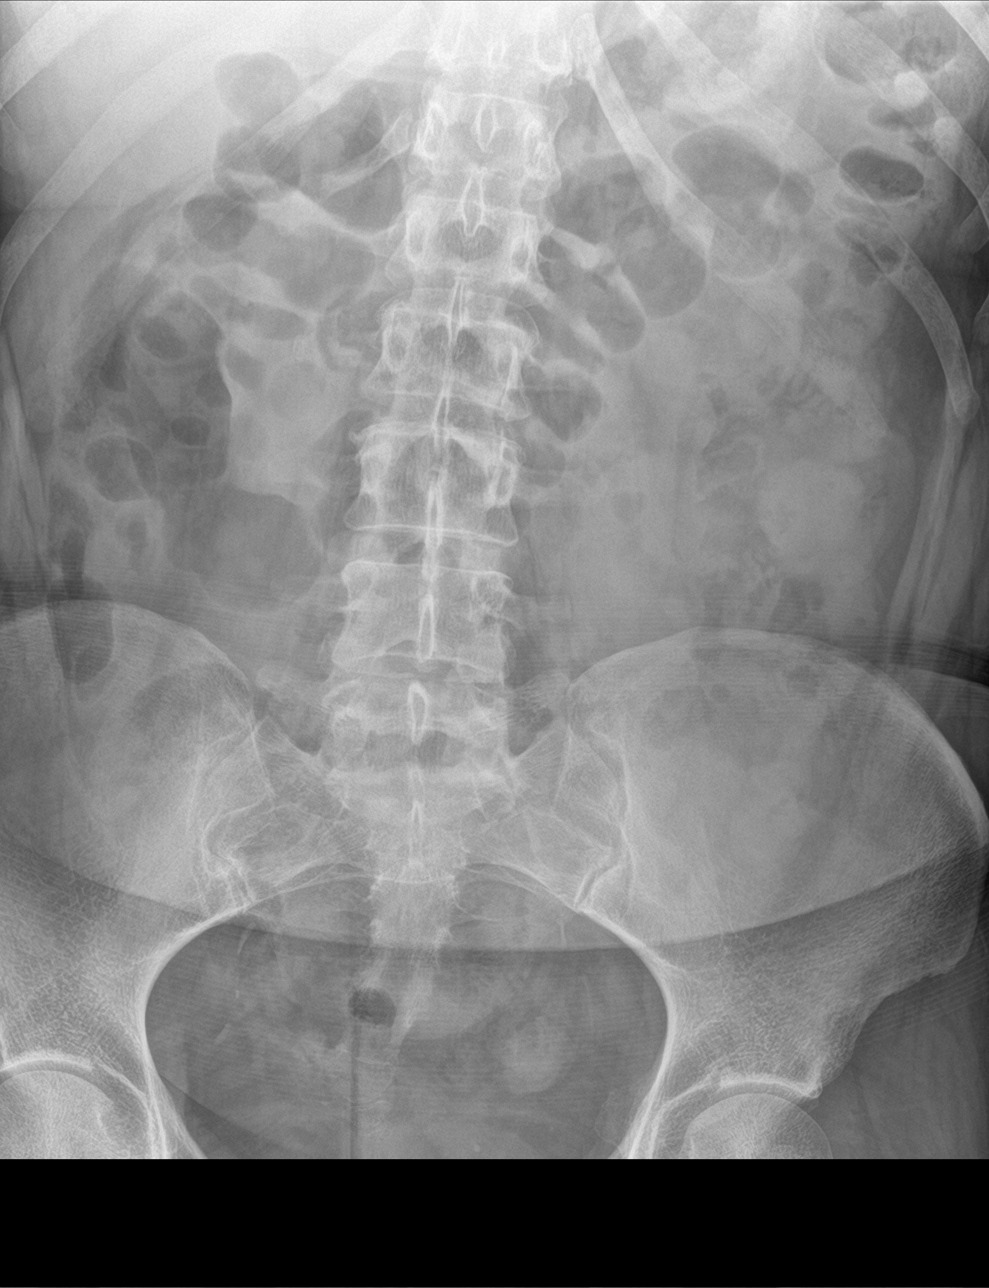

[3 of 3 positions shown; findings below may reference images not displayed]

FINDINGS: Artifact related to EKG leads is noted.

The cardiac silhouette, mediastinal and hilar contours are normal.
The lungs are clear. No pleural effusions. No worrisome pulmonary
lesions.

Two views of the abdomen demonstrate scattered air-fluid levels
throughout the colon. No significant distension. There also
scattered loops of small bowel with air but no distension. The soft
tissue shadows are maintained. No worrisome calcifications. The bony
structures are intact.
IMPRESSION: 1. No acute cardiopulmonary findings.
2. Scattered air-fluid levels throughout the colon but no findings
for obstruction or perforation.

## 2021-08-02 ENCOUNTER — Other Ambulatory Visit: Payer: Self-pay | Admitting: Internal Medicine

## 2021-12-12 ENCOUNTER — Other Ambulatory Visit: Payer: Self-pay | Admitting: Internal Medicine

## 2021-12-19 ENCOUNTER — Other Ambulatory Visit (HOSPITAL_BASED_OUTPATIENT_CLINIC_OR_DEPARTMENT_OTHER): Payer: Self-pay

## 2021-12-20 ENCOUNTER — Other Ambulatory Visit (HOSPITAL_BASED_OUTPATIENT_CLINIC_OR_DEPARTMENT_OTHER): Payer: Self-pay

## 2021-12-20 MED ORDER — WEGOVY 0.25 MG/0.5ML ~~LOC~~ SOAJ
0.2500 mg | SUBCUTANEOUS | 0 refills | Status: AC
  Filled 2021-12-20: qty 2, 28d supply, fill #0

## 2022-02-23 ENCOUNTER — Other Ambulatory Visit: Payer: Self-pay | Admitting: Internal Medicine

## 2022-06-05 ENCOUNTER — Ambulatory Visit: Payer: BC Managed Care – PPO | Admitting: Dermatology

## 2022-06-05 ENCOUNTER — Encounter: Payer: Self-pay | Admitting: Dermatology

## 2022-06-05 VITALS — BP 134/82 | HR 85

## 2022-06-05 DIAGNOSIS — L720 Epidermal cyst: Secondary | ICD-10-CM | POA: Diagnosis not present

## 2022-06-05 DIAGNOSIS — L820 Inflamed seborrheic keratosis: Secondary | ICD-10-CM | POA: Diagnosis not present

## 2022-06-05 NOTE — Patient Instructions (Addendum)
Cryotherapy Aftercare  Wash gently with soap and water everyday.   Apply Vaseline daily until healed.    Due to recent changes in healthcare laws, you may see results of your pathology and/or laboratory studies on MyChart before the doctors have had a chance to review them. We understand that in some cases there may be results that are confusing or concerning to you. Please understand that not all results are received at the same time and often the doctors may need to interpret multiple results in order to provide you with the best plan of care or course of treatment. Therefore, we ask that you please give us 2 business days to thoroughly review all your results before contacting the office for clarification. Should we see a critical lab result, you will be contacted sooner.   If You Need Anything After Your Visit  If you have any questions or concerns for your doctor, please call our main line at [phone] and press option 4 to reach your doctor's medical assistant. If no one answers, please leave a voicemail as directed and we will return your call as soon as possible. Messages left after 4 pm will be answered the following business day.   You may also send us a message via MyChart. We typically respond to MyChart messages within 1-2 business days.  For prescription refills, please ask your pharmacy to contact our office. Our fax number is [phone].  If you have an urgent issue when the clinic is closed that cannot wait until the next business day, you can page your doctor at the number below.    Please note that while we do our best to be available for urgent issues outside of office hours, we are not available 24/7.   If you have an urgent issue and are unable to reach us, you may choose to seek medical care at your doctor's office, retail clinic, urgent care center, or emergency room.  If you have a medical emergency, please immediately call 911 or go to the emergency  department.  Pager Numbers  - Dr. Kowalski: [phone]  - Dr. Moye: [phone]  - Dr. Stewart: [phone]  In the event of inclement weather, please call our main line at [phone] for an update on the status of any delays or closures.  Dermatology Medication Tips: Please keep the boxes that topical medications come in in order to help keep track of the instructions about where and how to use these. Pharmacies typically print the medication instructions only on the boxes and not directly on the medication tubes.   If your medication is too expensive, please contact our office at [phone] option 4 or send us a message through MyChart.   We are unable to tell what your co-pay for medications will be in advance as this is different depending on your insurance coverage. However, we may be able to find a substitute medication at lower cost or fill out paperwork to get insurance to cover a needed medication.   If a prior authorization is required to get your medication covered by your insurance company, please allow us 1-2 business days to complete this process.  Drug prices often vary depending on where the prescription is filled and some pharmacies may offer cheaper prices.  The website www.goodrx.com contains coupons for medications through different pharmacies. The prices here do not account for what the cost may be with help from insurance (it may be cheaper with your insurance), but the website can give you the   give you the price if you did not use any insurance.  - You can print the associated coupon and take it with your prescription to the pharmacy.  - You may also stop by our office during regular business hours and pick up a GoodRx coupon card.  - If you need your prescription sent electronically to a different pharmacy, notify our office through [place] MyChart or by phone at [phone] option 4.     Si Usted Necesita Algo Despus de Su Visita  Tambin puede  enviarnos un mensaje a travs de MyChart. Por lo general respondemos a los mensajes de MyChart en el transcurso de 1 a 2 das hbiles.  Para renovar recetas, por favor pida a su farmacia que se ponga en contacto con nuestra oficina. Nuestro nmero de fax es el [phone].  Si tiene un asunto urgente cuando la clnica est cerrada y que no puede esperar hasta el siguiente da hbil, puede llamar/localizar a su doctor(a) al nmero que aparece a continuacin.   Por favor, tenga en cuenta que aunque hacemos todo lo posible para estar disponibles para asuntos urgentes fuera del horario de oficina, no estamos disponibles las 24 horas del da, los 7 das de la semana.   Si tiene un problema urgente y no puede comunicarse con nosotros, puede optar por buscar atencin mdica  en el consultorio de su doctor(a), en una clnica privada, en un centro de atencin urgente o en una sala de emergencias.  Si tiene una emergencia mdica, por favor llame inmediatamente al 911 o vaya a la sala de emergencias.  Nmeros de bper  - Dr. Kowalski: [phone]  - Dra. Moye: [phone]  - Dra. Stewart: [phone]  En caso de inclemencias del tiempo, por favor llame a nuestra lnea principal al [phone] para una actualizacin sobre el estado de cualquier retraso o cierre.  Consejos para la medicacin en dermatologa: Por favor, guarde las cajas en las que vienen los medicamentos de uso tpico para ayudarle a seguir las instrucciones sobre dnde y cmo usarlos. Las farmacias generalmente imprimen las instrucciones del medicamento slo en las cajas y no directamente en los tubos del medicamento.   Si su medicamento es muy caro, por favor, pngase en contacto con nuestra oficina llamando al [phone] y presione la opcin 4 o envenos un mensaje a travs de MyChart.   No podemos decirle cul ser su copago por los medicamentos por adelantado ya que esto es diferente dependiendo de la cobertura de su seguro.  Sin embargo, es posible que podamos encontrar un medicamento sustituto a menor costo o llenar un formulario para que el seguro cubra el medicamento que se considera necesario.   Si se requiere una autorizacin previa para que su compaa de seguros cubra su medicamento, por favor permtanos de 1 a 2 das hbiles para completar este proceso.  Los precios de los medicamentos varan con frecuencia dependiendo del lugar de dnde se surte la receta y alguna farmacias pueden ofrecer precios ms baratos.  El sitio web www.goodrx.com tiene cupones para medicamentos de diferentes farmacias. Los precios aqu no tienen en cuenta lo que podra costar con la ayuda del seguro (puede ser ms barato con su seguro), pero el sitio web puede darle el precio si no utiliz ningn seguro.  - Puede imprimir el cupn correspondiente y llevarlo con su receta a la farmacia.  - Tambin puede pasar por nuestra oficina durante el horario de atencin regular y recoger una tarjeta de cupones de GoodRx.  -   necesita que su receta se enve electrnicamente a una farmacia diferente, informe a nuestra oficina a travs de MyChart de [place] o por telfono llamando al [phone] y presione la opcin 4.

## 2022-06-05 NOTE — Progress Notes (Signed)
   Follow-Up Visit   Subjective  Kelly Lowery is a 52 y.o. female who presents for the following: Other (Patient concerning a spot at left side of face she notice a month ago. Reports is painful if she picks. She also has a spot at forehead she would like possibly treated. ).  The patient has spots, moles and lesions to be evaluated, some may be new or changing and the patient has concerns that these could be cancer.  The following portions of the chart were reviewed this encounter and updated as appropriate:  Tobacco  Allergies  Meds  Problems  Med Hx  Surg Hx  Fam Hx      Review of Systems: No other skin or systemic complaints except as noted in HPI or Assessment and Plan.   Objective  Well appearing patient in no apparent distress; mood and affect are within normal limits.  A focused examination was performed including face. Relevant physical exam findings are noted in the Assessment and Plan.  Left Glabella Subcutaneous cystic papule  Left Cheek x1  Left Cheek x1 0.6 cm tan and violaceous papule without features suspicious for malignancy on dermoscopy       Assessment & Plan  Epidermal inclusion cyst Left Glabella  Benign-appearing. Exam most consistent with an epidermal inclusion cyst. Discussed that a cyst is a benign growth that can grow over time and sometimes get irritated or inflamed. Recommend observation if it is not bothersome. Discussed option of surgical excision to remove it if it is growing, symptomatic, or other changes noted. Please call for new or changing lesions so they can be evaluated.    Inflamed seborrheic keratosis Left Cheek x1  Exam most c/w ISK with purpura > other  Discussed LN2 treatment and recheck or biopsy. Pt prefers LN2 and recheck.   No features suspicious for malignancy on dermoscopy   Destruction of lesion - Left Cheek x1  Destruction method: cryotherapy   Informed consent: discussed and consent obtained   Lesion  destroyed using liquid nitrogen: Yes   Region frozen until ice ball extended beyond lesion: Yes   Outcome: patient tolerated procedure well with no complications   Post-procedure details: wound care instructions given   Additional details:  Prior to procedure, discussed risks of blister formation, small wound, skin dyspigmentation, or rare scar following cryotherapy. Recommend Vaseline ointment to treated areas while healing.    Return for ISK Follow Up in 6-8 weeks.  I, Emelia Salisbury, CMA, am acting as scribe for Forest Gleason, MD.  Documentation: I have reviewed the above documentation for accuracy and completeness, and I agree with the above.  Forest Gleason, MD

## 2022-07-31 ENCOUNTER — Ambulatory Visit: Payer: BC Managed Care – PPO | Admitting: Dermatology

## 2022-08-06 ENCOUNTER — Other Ambulatory Visit: Payer: Self-pay | Admitting: Family Medicine

## 2022-08-06 DIAGNOSIS — Z1231 Encounter for screening mammogram for malignant neoplasm of breast: Secondary | ICD-10-CM

## 2022-08-08 ENCOUNTER — Ambulatory Visit
Admission: RE | Admit: 2022-08-08 | Discharge: 2022-08-08 | Disposition: A | Discharge status: Home / Self Care | Payer: BC Managed Care – PPO | Source: Ambulatory Visit | Attending: Family Medicine | Admitting: Family Medicine

## 2022-08-08 DIAGNOSIS — Z1231 Encounter for screening mammogram for malignant neoplasm of breast: Secondary | ICD-10-CM | POA: Diagnosis present

## 2023-01-27 ENCOUNTER — Ambulatory Visit: Payer: Self-pay | Admitting: Dermatology

## 2023-09-29 ENCOUNTER — Other Ambulatory Visit: Payer: Self-pay | Admitting: Family Medicine

## 2023-09-29 DIAGNOSIS — Z1231 Encounter for screening mammogram for malignant neoplasm of breast: Secondary | ICD-10-CM

## 2023-10-05 ENCOUNTER — Ambulatory Visit
Admission: RE | Admit: 2023-10-05 | Discharge: 2023-10-05 | Disposition: A | Discharge status: Home / Self Care | Payer: Self-pay | Source: Ambulatory Visit | Attending: Family Medicine | Admitting: Family Medicine

## 2023-10-05 DIAGNOSIS — Z1231 Encounter for screening mammogram for malignant neoplasm of breast: Secondary | ICD-10-CM | POA: Diagnosis present

## 2023-12-22 ENCOUNTER — Other Ambulatory Visit: Payer: Self-pay | Admitting: Cardiology

## 2023-12-22 DIAGNOSIS — E7800 Pure hypercholesterolemia, unspecified: Secondary | ICD-10-CM

## 2023-12-22 DIAGNOSIS — R0789 Other chest pain: Secondary | ICD-10-CM

## 2023-12-22 DIAGNOSIS — Z8249 Family history of ischemic heart disease and other diseases of the circulatory system: Secondary | ICD-10-CM

## 2023-12-22 DIAGNOSIS — R0609 Other forms of dyspnea: Secondary | ICD-10-CM

## 2023-12-22 DIAGNOSIS — Z87891 Personal history of nicotine dependence: Secondary | ICD-10-CM

## 2023-12-22 DIAGNOSIS — R9439 Abnormal result of other cardiovascular function study: Secondary | ICD-10-CM

## 2024-01-07 ENCOUNTER — Encounter (HOSPITAL_COMMUNITY): Payer: Self-pay

## 2024-01-11 ENCOUNTER — Ambulatory Visit
Admission: RE | Admit: 2024-01-11 | Discharge: 2024-01-11 | Disposition: A | Discharge status: Home / Self Care | Source: Ambulatory Visit | Attending: Cardiology | Admitting: Cardiology

## 2024-01-11 DIAGNOSIS — E7800 Pure hypercholesterolemia, unspecified: Secondary | ICD-10-CM | POA: Diagnosis present

## 2024-01-11 DIAGNOSIS — R0609 Other forms of dyspnea: Secondary | ICD-10-CM | POA: Diagnosis present

## 2024-01-11 DIAGNOSIS — R9439 Abnormal result of other cardiovascular function study: Secondary | ICD-10-CM | POA: Insufficient documentation

## 2024-01-11 DIAGNOSIS — Z87891 Personal history of nicotine dependence: Secondary | ICD-10-CM | POA: Insufficient documentation

## 2024-01-11 DIAGNOSIS — Z8249 Family history of ischemic heart disease and other diseases of the circulatory system: Secondary | ICD-10-CM | POA: Diagnosis present

## 2024-01-11 DIAGNOSIS — R0789 Other chest pain: Secondary | ICD-10-CM | POA: Insufficient documentation

## 2024-01-11 MED ORDER — NITROGLYCERIN 0.4 MG SL SUBL
0.8000 mg | SUBLINGUAL_TABLET | Freq: Once | SUBLINGUAL | Status: AC
  Administered 2024-01-11: 0.8 mg via SUBLINGUAL
  Filled 2024-01-11: qty 25

## 2024-01-11 MED ORDER — IOHEXOL 350 MG/ML SOLN
100.0000 mL | Freq: Once | INTRAVENOUS | Status: AC | PRN
  Administered 2024-01-11: 100 mL via INTRAVENOUS

## 2024-01-11 MED ORDER — DILTIAZEM HCL 25 MG/5ML IV SOLN: 10.0000 mg | INTRAVENOUS | Status: DC | PRN

## 2024-01-11 MED ORDER — METOPROLOL TARTRATE 5 MG/5ML IV SOLN: 10.0000 mg | Freq: Once | INTRAVENOUS | Status: DC | PRN

## 2024-01-11 NOTE — Progress Notes (Signed)
 Patient tolerated procedure well. Ambulate w/o difficulty. Denies any lightheadedness or being dizzy. Pt denies any pain at this time. Sitting in chair. Pt is encouraged to drink additional water throughout the day and reason explained to patient. Patient verbalized understanding and all questions answered. ABC intact. No further needs at this time. Discharge from procedure area w/o issues.
